# Patient Record
Sex: Female | Born: 1955
Health system: Southern US, Community
[De-identification: ages and names within clinical notes are randomized; demographics above are authoritative.]

## PROBLEM LIST (undated history)

## (undated) DIAGNOSIS — I10 Essential (primary) hypertension: Secondary | ICD-10-CM

## (undated) DIAGNOSIS — M797 Fibromyalgia: Secondary | ICD-10-CM

## (undated) DIAGNOSIS — E669 Obesity, unspecified: Secondary | ICD-10-CM

## (undated) DIAGNOSIS — I714 Abdominal aortic aneurysm, without rupture, unspecified: Secondary | ICD-10-CM

## (undated) DIAGNOSIS — E785 Hyperlipidemia, unspecified: Secondary | ICD-10-CM

## (undated) DIAGNOSIS — I4510 Unspecified right bundle-branch block: Secondary | ICD-10-CM

## (undated) DIAGNOSIS — I255 Ischemic cardiomyopathy: Secondary | ICD-10-CM

## (undated) DIAGNOSIS — K219 Gastro-esophageal reflux disease without esophagitis: Secondary | ICD-10-CM

## (undated) DIAGNOSIS — M1990 Unspecified osteoarthritis, unspecified site: Secondary | ICD-10-CM

## (undated) DIAGNOSIS — I2510 Atherosclerotic heart disease of native coronary artery without angina pectoris: Secondary | ICD-10-CM

## (undated) HISTORY — PX: APPENDECTOMY: SHX54

## (undated) HISTORY — DX: Abdominal aortic aneurysm, without rupture, unspecified: I71.40

## (undated) HISTORY — PX: BACK SURGERY: SHX140

## (undated) HISTORY — PX: CORONARY ARTERY BYPASS GRAFT: SHX141

## (undated) HISTORY — PX: EYE SURGERY: SHX253

---

## 1997-12-06 ENCOUNTER — Inpatient Hospital Stay (HOSPITAL_COMMUNITY): Admission: EM | Admit: 1997-12-06 | Discharge: 1997-12-07 | Payer: Self-pay | Admitting: *Deleted

## 1998-06-25 ENCOUNTER — Inpatient Hospital Stay (HOSPITAL_COMMUNITY): Admission: EM | Admit: 1998-06-25 | Discharge: 1998-06-28 | Payer: Self-pay | Admitting: Emergency Medicine

## 1998-06-25 ENCOUNTER — Encounter: Payer: Self-pay | Admitting: Emergency Medicine

## 1998-06-25 ENCOUNTER — Encounter: Payer: Self-pay | Admitting: Internal Medicine

## 1998-06-26 ENCOUNTER — Encounter: Payer: Self-pay | Admitting: Internal Medicine

## 1998-06-27 ENCOUNTER — Encounter: Payer: Self-pay | Admitting: Pediatrics

## 2000-07-22 ENCOUNTER — Inpatient Hospital Stay (HOSPITAL_COMMUNITY): Admission: EM | Admit: 2000-07-22 | Discharge: 2000-07-23 | Payer: Self-pay | Admitting: Emergency Medicine

## 2000-07-22 ENCOUNTER — Encounter: Payer: Self-pay | Admitting: Emergency Medicine

## 2000-07-23 ENCOUNTER — Encounter: Payer: Self-pay | Admitting: *Deleted

## 2016-05-02 DIAGNOSIS — H2703 Aphakia, bilateral: Secondary | ICD-10-CM | POA: Diagnosis not present

## 2016-12-14 DIAGNOSIS — Z882 Allergy status to sulfonamides status: Secondary | ICD-10-CM | POA: Diagnosis not present

## 2016-12-14 DIAGNOSIS — I4510 Unspecified right bundle-branch block: Secondary | ICD-10-CM | POA: Diagnosis not present

## 2016-12-14 DIAGNOSIS — R030 Elevated blood-pressure reading, without diagnosis of hypertension: Secondary | ICD-10-CM | POA: Diagnosis not present

## 2016-12-14 DIAGNOSIS — I10 Essential (primary) hypertension: Secondary | ICD-10-CM | POA: Diagnosis not present

## 2016-12-14 DIAGNOSIS — H9202 Otalgia, left ear: Secondary | ICD-10-CM | POA: Diagnosis not present

## 2016-12-14 DIAGNOSIS — J320 Chronic maxillary sinusitis: Secondary | ICD-10-CM | POA: Diagnosis not present

## 2016-12-14 DIAGNOSIS — I252 Old myocardial infarction: Secondary | ICD-10-CM | POA: Diagnosis not present

## 2016-12-14 DIAGNOSIS — R42 Dizziness and giddiness: Secondary | ICD-10-CM | POA: Diagnosis not present

## 2016-12-14 DIAGNOSIS — M25512 Pain in left shoulder: Secondary | ICD-10-CM | POA: Diagnosis not present

## 2016-12-14 DIAGNOSIS — Z87891 Personal history of nicotine dependence: Secondary | ICD-10-CM | POA: Diagnosis not present

## 2016-12-14 DIAGNOSIS — I2510 Atherosclerotic heart disease of native coronary artery without angina pectoris: Secondary | ICD-10-CM | POA: Diagnosis not present

## 2016-12-14 DIAGNOSIS — Z951 Presence of aortocoronary bypass graft: Secondary | ICD-10-CM | POA: Diagnosis not present

## 2016-12-25 DIAGNOSIS — I10 Essential (primary) hypertension: Secondary | ICD-10-CM | POA: Diagnosis not present

## 2016-12-25 DIAGNOSIS — R1310 Dysphagia, unspecified: Secondary | ICD-10-CM | POA: Diagnosis not present

## 2016-12-28 ENCOUNTER — Inpatient Hospital Stay (HOSPITAL_COMMUNITY)
Admission: EM | Admit: 2016-12-28 | Discharge: 2016-12-30 | DRG: 282 | Disposition: A | Discharge status: Home / Self Care | Payer: Medicare Other | Attending: Cardiovascular Disease | Admitting: Cardiovascular Disease

## 2016-12-28 ENCOUNTER — Inpatient Hospital Stay (HOSPITAL_COMMUNITY)
Admission: EM | Disposition: A | Discharge status: Home / Self Care | Payer: Self-pay | Source: Home / Self Care | Attending: Cardiovascular Disease

## 2016-12-28 ENCOUNTER — Encounter (HOSPITAL_COMMUNITY): Payer: Self-pay

## 2016-12-28 ENCOUNTER — Ambulatory Visit (HOSPITAL_COMMUNITY): Admit: 2016-12-28 | Payer: Self-pay | Admitting: Cardiovascular Disease

## 2016-12-28 DIAGNOSIS — Z951 Presence of aortocoronary bypass graft: Secondary | ICD-10-CM

## 2016-12-28 DIAGNOSIS — I249 Acute ischemic heart disease, unspecified: Secondary | ICD-10-CM | POA: Diagnosis present

## 2016-12-28 DIAGNOSIS — E669 Obesity, unspecified: Secondary | ICD-10-CM | POA: Diagnosis present

## 2016-12-28 DIAGNOSIS — I213 ST elevation (STEMI) myocardial infarction of unspecified site: Secondary | ICD-10-CM

## 2016-12-28 DIAGNOSIS — R079 Chest pain, unspecified: Secondary | ICD-10-CM | POA: Diagnosis not present

## 2016-12-28 DIAGNOSIS — E785 Hyperlipidemia, unspecified: Secondary | ICD-10-CM | POA: Diagnosis present

## 2016-12-28 DIAGNOSIS — Z87891 Personal history of nicotine dependence: Secondary | ICD-10-CM

## 2016-12-28 DIAGNOSIS — M797 Fibromyalgia: Secondary | ICD-10-CM | POA: Diagnosis present

## 2016-12-28 DIAGNOSIS — I214 Non-ST elevation (NSTEMI) myocardial infarction: Secondary | ICD-10-CM | POA: Diagnosis not present

## 2016-12-28 DIAGNOSIS — Z882 Allergy status to sulfonamides status: Secondary | ICD-10-CM

## 2016-12-28 DIAGNOSIS — I4510 Unspecified right bundle-branch block: Secondary | ICD-10-CM | POA: Diagnosis present

## 2016-12-28 DIAGNOSIS — I2510 Atherosclerotic heart disease of native coronary artery without angina pectoris: Secondary | ICD-10-CM | POA: Diagnosis present

## 2016-12-28 DIAGNOSIS — Z6831 Body mass index (BMI) 31.0-31.9, adult: Secondary | ICD-10-CM

## 2016-12-28 DIAGNOSIS — I10 Essential (primary) hypertension: Secondary | ICD-10-CM | POA: Diagnosis present

## 2016-12-28 DIAGNOSIS — I255 Ischemic cardiomyopathy: Secondary | ICD-10-CM | POA: Diagnosis present

## 2016-12-28 DIAGNOSIS — R0789 Other chest pain: Secondary | ICD-10-CM | POA: Diagnosis not present

## 2016-12-28 HISTORY — DX: Hyperlipidemia, unspecified: E78.5

## 2016-12-28 HISTORY — PX: LEFT HEART CATH AND CORONARY ANGIOGRAPHY: CATH118249

## 2016-12-28 HISTORY — DX: Essential (primary) hypertension: I10

## 2016-12-28 HISTORY — DX: Ischemic cardiomyopathy: I25.5

## 2016-12-28 HISTORY — DX: Fibromyalgia: M79.7

## 2016-12-28 HISTORY — DX: Acute ischemic heart disease, unspecified: I24.9

## 2016-12-28 HISTORY — DX: ST elevation (STEMI) myocardial infarction of unspecified site: I21.3

## 2016-12-28 HISTORY — DX: Unspecified right bundle-branch block: I45.10

## 2016-12-28 HISTORY — DX: Obesity, unspecified: E66.9

## 2016-12-28 HISTORY — DX: Atherosclerotic heart disease of native coronary artery without angina pectoris: I25.10

## 2016-12-28 LAB — COMPREHENSIVE METABOLIC PANEL
ALT: 15 U/L (ref 14–54)
ANION GAP: 13 (ref 5–15)
AST: 18 U/L (ref 15–41)
Albumin: 3.7 g/dL (ref 3.5–5.0)
Alkaline Phosphatase: 57 U/L (ref 38–126)
BUN: 7 mg/dL (ref 6–20)
CHLORIDE: 104 mmol/L (ref 101–111)
CO2: 23 mmol/L (ref 22–32)
CREATININE: 0.69 mg/dL (ref 0.44–1.00)
Calcium: 9 mg/dL (ref 8.9–10.3)
Glucose, Bld: 104 mg/dL — ABNORMAL HIGH (ref 65–99)
Potassium: 3.2 mmol/L — ABNORMAL LOW (ref 3.5–5.1)
SODIUM: 140 mmol/L (ref 135–145)
Total Bilirubin: 0.6 mg/dL (ref 0.3–1.2)
Total Protein: 6.8 g/dL (ref 6.5–8.1)

## 2016-12-28 LAB — CBC
HEMATOCRIT: 43.9 % (ref 36.0–46.0)
HEMATOCRIT: 40.4 % (ref 36.0–46.0)
HEMOGLOBIN: 13.5 g/dL (ref 12.0–15.0)
Hemoglobin: 14.5 g/dL (ref 12.0–15.0)
MCH: 31 pg (ref 26.0–34.0)
MCH: 30.8 pg (ref 26.0–34.0)
MCHC: 33 g/dL (ref 30.0–36.0)
MCHC: 33.4 g/dL (ref 30.0–36.0)
MCV: 94 fL (ref 78.0–100.0)
MCV: 92.2 fL (ref 78.0–100.0)
Platelets: 227 10*3/uL (ref 150–400)
Platelets: 236 10*3/uL (ref 150–400)
RBC: 4.67 MIL/uL (ref 3.87–5.11)
RBC: 4.38 MIL/uL (ref 3.87–5.11)
RDW: 13.3 % (ref 11.5–15.5)
RDW: 12.9 % (ref 11.5–15.5)
WBC: 10.4 10*3/uL (ref 4.0–10.5)
WBC: 10.3 10*3/uL (ref 4.0–10.5)

## 2016-12-28 LAB — BASIC METABOLIC PANEL
ANION GAP: 12 (ref 5–15)
BUN: 7 mg/dL (ref 6–20)
CHLORIDE: 104 mmol/L (ref 101–111)
CO2: 22 mmol/L (ref 22–32)
Calcium: 9.4 mg/dL (ref 8.9–10.3)
Creatinine, Ser: 0.72 mg/dL (ref 0.44–1.00)
Glucose, Bld: 102 mg/dL — ABNORMAL HIGH (ref 65–99)
POTASSIUM: 3.5 mmol/L (ref 3.5–5.1)
SODIUM: 138 mmol/L (ref 135–145)

## 2016-12-28 LAB — LIPID PANEL
CHOLESTEROL: 216 mg/dL — AB (ref 0–200)
HDL: 29 mg/dL — AB (ref >40)
LDL Cholesterol: 128 mg/dL — ABNORMAL HIGH (ref 0–99)
TRIGLYCERIDES: 297 mg/dL — AB (ref <150)
Total CHOL/HDL Ratio: 7.4 RATIO
VLDL: 59 mg/dL — AB (ref 0–40)

## 2016-12-28 LAB — APTT: aPTT: 157 seconds — ABNORMAL HIGH (ref 24–36)

## 2016-12-28 LAB — POCT I-STAT TROPONIN I: TROPONIN I, POC: 0.04 ng/mL (ref 0.00–0.08)

## 2016-12-28 LAB — PROTIME-INR
INR: 1.21
Prothrombin Time: 15.4 seconds — ABNORMAL HIGH (ref 11.4–15.2)

## 2016-12-28 LAB — TROPONIN I: Troponin I: 0.13 ng/mL (ref <0.03)

## 2016-12-28 SURGERY — LEFT HEART CATH AND CORONARY ANGIOGRAPHY
Anesthesia: LOCAL

## 2016-12-28 MED ORDER — MIDAZOLAM HCL 2 MG/2ML IJ SOLN
INTRAMUSCULAR | Status: AC
  Filled 2016-12-28: qty 2

## 2016-12-28 MED ORDER — NITROGLYCERIN 1 MG/10 ML FOR IR/CATH LAB
INTRA_ARTERIAL | Status: AC
  Filled 2016-12-28: qty 10

## 2016-12-28 MED ORDER — ENOXAPARIN SODIUM 40 MG/0.4ML ~~LOC~~ SOLN
40.0000 mg | SUBCUTANEOUS | Status: DC
  Administered 2016-12-29: 40 mg via SUBCUTANEOUS
  Filled 2016-12-28: qty 0.4

## 2016-12-28 MED ORDER — SODIUM CHLORIDE 0.9 % IV SOLN
INTRAVENOUS | Status: DC
  Administered 2016-12-28 – 2016-12-29 (×2): via INTRAVENOUS

## 2016-12-28 MED ORDER — SODIUM CHLORIDE 0.9% FLUSH
3.0000 mL | Freq: Two times a day (BID) | INTRAVENOUS | Status: DC
  Administered 2016-12-28 – 2016-12-30 (×4): 3 mL via INTRAVENOUS

## 2016-12-28 MED ORDER — HEPARIN (PORCINE) IN NACL 2-0.9 UNIT/ML-% IJ SOLN
INTRAMUSCULAR | Status: AC
  Filled 2016-12-28: qty 1000

## 2016-12-28 MED ORDER — LIDOCAINE HCL (PF) 1 % IJ SOLN
INTRAMUSCULAR | Status: AC
  Filled 2016-12-28: qty 30

## 2016-12-28 MED ORDER — ASPIRIN 81 MG PO CHEW
81.0000 mg | CHEWABLE_TABLET | Freq: Every day | ORAL | Status: DC
  Administered 2016-12-29 – 2016-12-30 (×2): 81 mg via ORAL
  Filled 2016-12-28 (×2): qty 1

## 2016-12-28 MED ORDER — CLOPIDOGREL BISULFATE 75 MG PO TABS
75.0000 mg | ORAL_TABLET | Freq: Every day | ORAL | Status: DC
  Administered 2016-12-29 – 2016-12-30 (×2): 75 mg via ORAL
  Filled 2016-12-28 (×2): qty 1

## 2016-12-28 MED ORDER — MIDAZOLAM HCL 2 MG/2ML IJ SOLN
INTRAMUSCULAR | Status: DC | PRN
  Administered 2016-12-28: 2 mg via INTRAVENOUS

## 2016-12-28 MED ORDER — FENTANYL CITRATE (PF) 100 MCG/2ML IJ SOLN
INTRAMUSCULAR | Status: DC | PRN
  Administered 2016-12-28: 25 ug via INTRAVENOUS

## 2016-12-28 MED ORDER — ISOSORBIDE MONONITRATE ER 30 MG PO TB24
30.0000 mg | ORAL_TABLET | Freq: Every day | ORAL | Status: DC
  Administered 2016-12-28: 30 mg via ORAL
  Filled 2016-12-28: qty 1

## 2016-12-28 MED ORDER — LIDOCAINE HCL (PF) 1 % IJ SOLN
INTRAMUSCULAR | Status: DC | PRN
Start: 2016-12-28 — End: 2016-12-28
  Administered 2016-12-28: 17 mL via SUBCUTANEOUS

## 2016-12-28 MED ORDER — ZOLPIDEM TARTRATE 5 MG PO TABS: 5.0000 mg | ORAL_TABLET | Freq: Every evening | ORAL | Status: DC | PRN

## 2016-12-28 MED ORDER — METOPROLOL TARTRATE 25 MG PO TABS
25.0000 mg | ORAL_TABLET | Freq: Two times a day (BID) | ORAL | Status: DC
  Administered 2016-12-28 – 2016-12-30 (×4): 25 mg via ORAL
  Filled 2016-12-28 (×4): qty 1

## 2016-12-28 MED ORDER — IOPAMIDOL (ISOVUE-370) INJECTION 76%
INTRAVENOUS | Status: AC
  Filled 2016-12-28: qty 100

## 2016-12-28 MED ORDER — ACETAMINOPHEN 325 MG PO TABS
650.0000 mg | ORAL_TABLET | ORAL | Status: DC | PRN
  Administered 2016-12-29 – 2016-12-30 (×4): 650 mg via ORAL
  Filled 2016-12-28 (×4): qty 2

## 2016-12-28 MED ORDER — ONDANSETRON HCL 4 MG/2ML IJ SOLN: 4.0000 mg | Freq: Four times a day (QID) | INTRAMUSCULAR | Status: DC | PRN

## 2016-12-28 MED ORDER — POTASSIUM CHLORIDE ER 10 MEQ PO TBCR
40.0000 meq | EXTENDED_RELEASE_TABLET | Freq: Once | ORAL | Status: AC
  Administered 2016-12-28: 40 meq via ORAL
  Filled 2016-12-28 (×2): qty 4

## 2016-12-28 MED ORDER — SODIUM CHLORIDE 0.9 % IV SOLN: 250.0000 mL | INTRAVENOUS | Status: DC | PRN

## 2016-12-28 MED ORDER — SODIUM CHLORIDE 0.9% FLUSH: 3.0000 mL | INTRAVENOUS | Status: DC | PRN

## 2016-12-28 MED ORDER — FENTANYL CITRATE (PF) 100 MCG/2ML IJ SOLN
INTRAMUSCULAR | Status: AC
  Filled 2016-12-28: qty 2

## 2016-12-28 MED ORDER — DIAZEPAM 5 MG PO TABS: 5.0000 mg | ORAL_TABLET | ORAL | Status: DC | PRN

## 2016-12-28 MED ORDER — IOPAMIDOL (ISOVUE-370) INJECTION 76%
INTRAVENOUS | Status: DC | PRN
  Administered 2016-12-28: 110 mL via INTRA_ARTERIAL

## 2016-12-28 MED ORDER — ATORVASTATIN CALCIUM 80 MG PO TABS
80.0000 mg | ORAL_TABLET | Freq: Every day | ORAL | Status: DC
  Administered 2016-12-29: 80 mg via ORAL
  Filled 2016-12-28: qty 1

## 2016-12-28 SURGICAL SUPPLY — 11 items
CATH INFINITI 5 FR IM (CATHETERS) ×1 IMPLANT
CATH INFINITI 5FR MULTPACK ANG (CATHETERS) ×1 IMPLANT
KIT ENCORE 26 ADVANTAGE (KITS) ×1 IMPLANT
KIT HEART LEFT (KITS) ×2 IMPLANT
PACK CARDIAC CATHETERIZATION (CUSTOM PROCEDURE TRAY) ×2 IMPLANT
SHEATH PINNACLE 6F 10CM (SHEATH) ×1 IMPLANT
SYR MEDRAD MARK V 150ML (SYRINGE) ×2 IMPLANT
TRANSDUCER W/STOPCOCK (MISCELLANEOUS) ×2 IMPLANT
TUBING CIL FLEX 10 FLL-RA (TUBING) ×2 IMPLANT
WIRE EMERALD 3MM-J .035X150CM (WIRE) IMPLANT
WIRE EMERALD 3MM-J .035X260CM (WIRE) ×1 IMPLANT

## 2016-12-28 NOTE — H&P (Signed)
Cardiology Admission History and Physical:   Patient ID: VINETA CARONE; [id number]; 1955-11-26   Admission date: 12/28/2016  Primary Care Provider: No primary care provider on file. Primary Cardiologist: New Primary Electrophysiologist:  None  Chief Complaint:  Evolving STEMI  Patient Profile:   TASHIMA SCARPULLA is a 61 y.o. female with a history of HTN, CAD s/p previous CABG x4 and stents, RBBB and fibromyalgia who presented to [facility] today via EMS as a code STEMI and taken emergently to cath lab for coronary angiography.   History of Present Illness:   Ms. Konen has apparently not seen a medical provider including a PCP or Cardiologist for many years due to financial issues. Per review of CareEverywhere, she was most recently evaluated in the [facility] ED in [place] on 12/14/16 for dizziness and treated with meclizine and Abx for a sinus infection. There was mention of a RBBB at that time. Otherwise, there is no available records in EPIC to review. Chart Review does show admissions under our cardiology group in 1999, 2000 and 2002, but no records are available.   She was in her usual state of health until this evening when she complained of nausea while walking around her kitchen as well as chest pain. EMS was called who called a code STEMI for slight ST elevations in lead I and AVL. Dr Tresa Endo reviewed her ECG and agreed that with ongoing chest pain and ECG changes she should be brought back emergently to the cath lab. Further history was unable to be obtained as she was already draped undergoing coronary angiography at the time of my evaluation.    Past Medical History:  Diagnosis Date  . CAD (coronary artery disease)   . Hypertension     Past Surgical History:  Procedure Laterality Date  . CORONARY ARTERY BYPASS GRAFT       Medications Prior to Admission: Prior to Admission medications   Not on File     Allergies:    Allergies  Allergen Reactions  . Sulfa Antibiotics Rash     Social History:   Social History   Social History  . Marital status: Married    Spouse name: N/A  . Number of children: N/A  . Years of education: N/A   Occupational History  . Not on file.   Social History Main Topics  . Smoking status: Former Smoker    Quit date: 12/28/1996  . Smokeless tobacco: Not on file  . Alcohol use No  . Drug use: Unknown  . Sexual activity: Not on file   Other Topics Concern  . Not on file   Social History Narrative  . No narrative on file    Family History:   The patient's family history is not on file.  Not able to be obtained given acuity of situation   ROS:  Please see the history of present illness.  All other ROS reviewed and negative.     Physical Exam/Data:   Vitals:   12/28/16 1900 12/28/16 1916  BP: (!) 171/94   Pulse: 98   Resp: 13   Temp: 98.7 F (37.1 C)   TempSrc: Oral   SpO2: 100% 100%  Weight: 190 lb (86.2 kg)   Height: 5\' 5"  (1.651 m)    No intake or output data in the 24 hours ending 12/28/16 1950 Filed Weights   12/28/16 1900  Weight: 190 lb (86.2 kg)   Body mass index is 31.62 kg/m.  General:  Well nourished, well developed,  in no acute distress HEENT: normal Lymph: no adenopathy Neck: noJVD Endocrine:  No thryomegaly Vascular: No carotid bruits; FA pulses 2+ bilaterally without bruits  Cardiac:  normal S1, S2; RRR; no murmur  Lungs:  clear to auscultation bilaterally, no wheezing, rhonchi or rales  Abd: soft, nontender, no hepatomegaly  Ext: no edema Musculoskeletal:  No deformities, BUE and BLE strength normal and equal Skin: warm and dry  Neuro:  CNs 2-12 intact, no focal abnormalities noted Psych:  Normal affect    EKG:  The ECG that was done was personally reviewed and demonstrates RBBB with slight STE in lead I and AVL  Relevant CV Studies: None  Laboratory Data:  Chemistry  Recent Labs Lab 12/28/16 1857  NA 138  K 3.5  CL 104  CO2 22  GLUCOSE 102*  BUN 7  CREATININE 0.72   CALCIUM 9.4  GFRNONAA >60  GFRAA >60  ANIONGAP 12    No results for input(s): PROT, ALBUMIN, AST, ALT, ALKPHOS, BILITOT in the last 168 hours. Hematology  Recent Labs Lab 12/28/16 1857  WBC 10.4  RBC 4.67  HGB 14.5  HCT 43.9  MCV 94.0  MCH 31.0  MCHC 33.0  RDW 13.3  PLT 227   Cardiac EnzymesNo results for input(s): TROPONINI in the last 168 hours.   Recent Labs Lab 12/28/16 1906  TROPIPOC 0.04    BNPNo results for input(s): BNP, PROBNP in the last 168 hours.  DDimer No results for input(s): DDIMER in the last 168 hours.  Radiology/Studies:  No results found.  Assessment and Plan:   Evolving STEMI: with ongoing chest pain she was taken back for emergent cardiac catheterization with possible PCI   Severity of Illness: The appropriate patient status for this patient is INPATIENT. Inpatient status is judged to be reasonable and necessary in order to provide the required intensity of service to ensure the patient's safety. The patient's presenting symptoms, physical exam findings, and initial radiographic and laboratory data in the context of their chronic comorbidities is felt to place them at high risk for further clinical deterioration. Furthermore, it is not anticipated that the patient will be medically stable for discharge from the hospital within 2 midnights of admission. The following factors support the patient status of inpatient.   " The patient's presenting symptoms include chest pain . " The worrisome physical exam findings include: patient appeared uncomfotable. " The initial radiographic and laboratory data are worrisome because of: possible STE on ECG . " The chronic co-morbidities include CAD s/p previous bypass and stents.   * I certify that at the point of admission it is my clinical judgment that the patient will require inpatient hospital care spanning beyond 2 midnights from the point of admission due to high intensity of service, high risk for  further deterioration and high frequency of surveillance required.*    Signed, Cline Crock, PA-C  12/28/2016 7:50 PM   Patient seen and examined. Agree with assessment and plan. Ms. Estera Ozier is a 61 year old female who has a history of hypertension, and had smoked for several years prior to undergoing CABG revascularization surgery in 1998 by Dr. Tyrone Sage.  At that time.  She quit smoking.  She reports that for vessels were bypassed.  She has undergone multiple stents.  She has right bundle branch block, and fibromyalgia.  She states that she has not seen her cardiologist in several years.  She has been [facility] by[facility] cardi[facility].  She is not had any recent  chest pain symptomatology.  Tonight, she developed chest tightness associated with nausea and diarrhea and shortness of breath.  EMS was called to her house.  Her ECG showed right bundle branch block and raised concern for ST segment elevation in the lateral and anterolateral leads.  A code STEMI was activated and [facility]  Upon arrival, her chest pain has abated.  She still complains of mild indigestion-like symptoms.  I discussed the catheterization procedure with the patient who agrees to undergo the emergent catheterization procedure with possible intervention if necessary.  Laboratory was sent from the Cath Lab.   Lennette Biharihomas A. Windie Marasco, MD, [facility] 12/28/2016 8:38 PM

## 2016-12-28 NOTE — Progress Notes (Signed)
CRITICAL VALUE ALERT  Critical Value: Troponin 0.13, Potassium 3.2  Date & Time Notied:  2130, 12/28/2016  Provider Notified: Means  Orders Received/Actions taken:Yes

## 2016-12-28 NOTE — ED Notes (Addendum)
Pt transported to cath lab by this RN and another RN. Pt on zoll, AxOx4. Pt given 4000 heparin by this RN prior to escort.

## 2016-12-28 NOTE — ED Triage Notes (Signed)
Per EMS, pt from home with CP x 1 hour with nausea while pt was walking around her kitchen. Code stemi activated. Pt has hx of CABG x 4 in 1998. Pt states that pain is easing now. Received 324 asa. Pt was recently placed on BP medication. VS 120/82, HR 102, 96% on RA, RR 18. Breath sounds clear. Pt AxOx4. Pt was placed on antibiotic recently for sinus infection.

## 2016-12-28 NOTE — Progress Notes (Signed)
   Family escorted to [facility] waiting area.

## 2016-12-29 ENCOUNTER — Encounter (HOSPITAL_COMMUNITY): Payer: Self-pay | Admitting: *Deleted

## 2016-12-29 LAB — BASIC METABOLIC PANEL
ANION GAP: 9 (ref 5–15)
BUN: 5 mg/dL — AB (ref 6–20)
CO2: 25 mmol/L (ref 22–32)
CREATININE: 0.63 mg/dL (ref 0.44–1.00)
Calcium: 8.7 mg/dL — ABNORMAL LOW (ref 8.9–10.3)
Chloride: 107 mmol/L (ref 101–111)
GFR calc Af Amer: >60 mL/min (ref >60)
GFR calc non Af Amer: >60 mL/min (ref >60)
GLUCOSE: 103 mg/dL — AB (ref 65–99)
POTASSIUM: 4 mmol/L (ref 3.5–5.1)
Sodium: 141 mmol/L (ref 135–145)

## 2016-12-29 LAB — CBC
HEMATOCRIT: 38.5 % (ref 36.0–46.0)
Hemoglobin: 12.8 g/dL (ref 12.0–15.0)
MCH: 31.1 pg (ref 26.0–34.0)
MCHC: 33.2 g/dL (ref 30.0–36.0)
MCV: 93.7 fL (ref 78.0–100.0)
PLATELETS: 212 10*3/uL (ref 150–400)
RBC: 4.11 MIL/uL (ref 3.87–5.11)
RDW: 13 % (ref 11.5–15.5)
WBC: 8.5 10*3/uL (ref 4.0–10.5)

## 2016-12-29 LAB — MRSA PCR SCREENING: MRSA by PCR: NEGATIVE

## 2016-12-29 MED ORDER — ISOSORBIDE MONONITRATE ER 60 MG PO TB24
60.0000 mg | ORAL_TABLET | Freq: Every day | ORAL | Status: DC
  Administered 2016-12-29 – 2016-12-30 (×2): 60 mg via ORAL
  Filled 2016-12-29 (×2): qty 1

## 2016-12-29 MED ORDER — ALUM & MAG HYDROXIDE-SIMETH 200-200-20 MG/5ML PO SUSP
30.0000 mL | Freq: Four times a day (QID) | ORAL | Status: DC | PRN
  Administered 2016-12-29: 30 mL via ORAL
  Filled 2016-12-29: qty 30

## 2016-12-29 NOTE — Progress Notes (Signed)
Progress Note  Patient Name: Sharon Saunders Date of Encounter: 12/29/2016  Primary Cardiologist: Tresa EndoKelly (new)  Subjective   Feeling much improved since yesterday.  No further chest pain.  Inpatient Medications    Scheduled Meds: . aspirin  81 mg Oral Daily  . atorvastatin  80 mg Oral q1800  . clopidogrel  75 mg Oral Q breakfast  . enoxaparin (LOVENOX) injection  40 mg Subcutaneous Q24H  . isosorbide mononitrate  30 mg Oral Daily  . metoprolol tartrate  25 mg Oral BID  . sodium chloride flush  3 mL Intravenous Q12H   Continuous Infusions: . sodium chloride 100 mL/hr at 12/29/16 0454  . sodium chloride     PRN Meds: sodium chloride, acetaminophen, alum & mag hydroxide-simeth, diazepam, ondansetron (ZOFRAN) IV, sodium chloride flush, zolpidem   Vital Signs    Vitals:   12/29/16 0430 12/29/16 0500 12/29/16 0600 12/29/16 0615  BP:  108/68 111/74   Pulse:  70 74 72  Resp:  17 13 (!) 21  Temp: 98.4 F (36.9 C)     TempSrc: Oral     SpO2:  99% 100% 96%  Weight:    194 lb 0.1 oz (88 kg)  Height:        Intake/Output Summary (Last 24 hours) at 12/29/16 0838 Last data filed at 12/29/16 0600  Gross per 24 hour  Intake          1356.25 ml  Output              400 ml  Net           956.25 ml   Filed Weights   12/28/16 1900 12/28/16 2045 12/29/16 0615  Weight: 190 lb (86.2 kg) 190 lb (86.2 kg) 194 lb 0.1 oz (88 kg)    Telemetry    Sinus rhythm - Personally Reviewed  ECG    SR, RBBB - Personally Reviewed  Physical Exam   GEN: No acute distress.   Neck: No JVD Cardiac: RRR, no murmurs, rubs, or gallops.  Respiratory: Clear to auscultation bilaterally. GI: Soft, nontender, non-distended  MS: No edema; No deformity. Neuro:  Nonfocal  Psych: Normal affect   Labs    Chemistry Recent Labs Lab 12/28/16 1857 12/28/16 1930 12/29/16 0241  NA 138 140 141  K 3.5 3.2* 4.0  CL 104 104 107  CO2 22 23 25   GLUCOSE 102* 104* 103*  BUN 7 7 5*  CREATININE  0.72 0.69 0.63  CALCIUM 9.4 9.0 8.7*  PROT  --  6.8  --   ALBUMIN  --  3.7  --   AST  --  18  --   ALT  --  15  --   ALKPHOS  --  57  --   BILITOT  --  0.6  --   GFRNONAA >60 >60 >60  GFRAA >60 >60 >60  ANIONGAP 12 13 9      Hematology Recent Labs Lab 12/28/16 1857 12/28/16 1930 12/29/16 0241  WBC 10.4 10.3 8.5  RBC 4.67 4.38 4.11  HGB 14.5 13.5 12.8  HCT 43.9 40.4 38.5  MCV 94.0 92.2 93.7  MCH 31.0 30.8 31.1  MCHC 33.0 33.4 33.2  RDW 13.3 12.9 13.0  PLT 227 236 212    Cardiac Enzymes Recent Labs Lab 12/28/16 1930  TROPONINI 0.13*    Recent Labs Lab 12/28/16 1906  TROPIPOC 0.04     BNPNo results for input(s): BNP, PROBNP in the last 168 hours.  DDimer No results for input(s): DDIMER in the last 168 hours.   Radiology    No results found.  Cardiac Studies   LHC  LM-2 lesion, 50 %stenosed.  LM-1 lesion, 20 %stenosed.  Ost 1st Diag lesion, 100 %stenosed.  Prox LAD to Mid LAD lesion, 80 %stenosed.  Mid LAD to Dist LAD lesion, 80 %stenosed.  Dist LAD lesion, 90 %stenosed.  3rd Mrg lesion, 40 %stenosed.  Ost 3rd Mrg lesion, 30 %stenosed.  Prox RCA lesion, 40 %stenosed.  Mid RCA lesion, 50 %stenosed.  Dist RCA lesion, 40 %stenosed.  RPDA lesion, 50 %stenosed  Patient Profile     61 y.o. female with a history of HTN, CAD s/p previous CABG x4 and stents, RBBB and fibromyalgia who presented to [facility] today via EMS as a code STEMI and taken emergently to cath lab for coronary angiography.   Assessment & Plan    1. NSTEMI: currently with mild chest discomfort but much improved. Has severe CAD with the decision made for medical therapy. Currently on plavix, aspirin, atorvastatin, imdur (increase to 60). BP well controlled with metoprolol. Plan to transfer to the floor and ambulate. If chest pain free, may be discharge tomorrow.  2. Hypertension: well controlled today on current medications. No changes  3. Hyperlipidemia: LDL above goal of 70.  Recently started on 80 atorvastatin.  Signed, Cathe Bilger Jorja Loa, MD  12/29/2016, 8:38 AM

## 2016-12-29 NOTE — ED Provider Notes (Signed)
[facility]-EMERGENCY DEPT Provider Note   CSN: [id number] Arrival date & time: 12/28/16  1853     History   Chief Complaint Chief Complaint  Patient presents with  . Code STEMI    HPI Sharon Saunders is a 61 y.o. female. With history of CAD s/p CABG 1998, HTN, Who presents with concerns for STEMI. Patient reports onset of substernal chest pain 1 hour prior to arrival. Pain is described as indigestion type pain. She denies any concurrent shortness of breath, lightheadedness, palpitations, abdominal pain, nausea or vomiting. She reports symptoms are different from her previous heart attack. Recent medication changes include that she was started on an antihypertensive.  HPI  Past Medical History:  Diagnosis Date  . CAD (coronary artery disease)   . Fibromyalgia   . Hypertension   . RBBB     Patient Active Problem List   Diagnosis Date Noted  . STEMI (ST elevation myocardial infarction) (HCC) 12/28/2016  . ACS (acute coronary syndrome) (HCC) 12/28/2016  . Hypertension   . CAD (coronary artery disease)   . RBBB   . Fibromyalgia     Past Surgical History:  Procedure Laterality Date  . CORONARY ARTERY BYPASS GRAFT      OB History    No data available       Home Medications    Prior to Admission medications   Not on File    Family History History reviewed. No pertinent family history.  Social History Social History  Substance Use Topics  . Smoking status: Former Smoker    Quit date: 12/28/1996  . Smokeless tobacco: Former Neurosurgeon  . Alcohol use No     Allergies   Sulfa antibiotics   Review of Systems Review of Systems  Constitutional: Negative for chills and fever.  HENT: Negative for ear pain and sore throat.   Eyes: Negative for pain and visual disturbance.  Respiratory: Negative for cough and shortness of breath.   Cardiovascular: Positive for chest pain. Negative for palpitations.  Gastrointestinal: Negative for abdominal pain and vomiting.    Genitourinary: Negative for dysuria and hematuria.  Musculoskeletal: Negative for arthralgias and back pain.  Skin: Negative for color change and rash.  Neurological: Negative for seizures and syncope.  All other systems reviewed and are negative.    Physical Exam Updated Vital Signs BP 130/79   Pulse 67   Temp 98.3 F (36.8 C) (Oral)   Resp 14   Ht 5\' 5"  (1.651 m)   Wt 86.2 kg (190 lb)   SpO2 96%   BMI 31.62 kg/m   Physical Exam  Constitutional: She appears well-developed and well-nourished. She appears distressed.  HENT:  Head: Normocephalic and atraumatic.  Eyes: Conjunctivae are normal.  Neck: Neck supple.  Cardiovascular: Normal rate and regular rhythm.   No murmur heard. Pulmonary/Chest: Effort normal and breath sounds normal. No respiratory distress.  Abdominal: Soft. There is no tenderness.  Musculoskeletal: She exhibits no edema.  Neurological: She is alert.  Skin: Skin is warm and dry.  Psychiatric: She has a normal mood and affect.  Nursing note and vitals reviewed.    ED Treatments / Results  Labs (all labs ordered are listed, but only abnormal results are displayed) Labs Reviewed  BASIC METABOLIC PANEL - Abnormal; Notable for the following:       Result Value   Glucose, Bld 102 (*)    All other components within normal limits  COMPREHENSIVE METABOLIC PANEL - Abnormal; Notable for the following:  Potassium 3.2 (*)    Glucose, Bld 104 (*)    All other components within normal limits  LIPID PANEL - Abnormal; Notable for the following:    Cholesterol 216 (*)    Triglycerides 297 (*)    HDL 29 (*)    VLDL 59 (*)    LDL Cholesterol 128 (*)    All other components within normal limits  PROTIME-INR - Abnormal; Notable for the following:    Prothrombin Time 15.4 (*)    All other components within normal limits  APTT - Abnormal; Notable for the following:    aPTT 157 (*)    All other components within normal limits  TROPONIN I - Abnormal; Notable  for the following:    Troponin I 0.13 (*)    All other components within normal limits  MRSA PCR SCREENING  CBC  CBC  HEMOGLOBIN A1C  CBC  BASIC METABOLIC PANEL  I-STAT TROPONIN, ED  POCT I-STAT TROPONIN I    EKG  EKG Interpretation None       Radiology No results found.  Procedures Procedures (including critical care time)  Medications Ordered in ED Medications  acetaminophen (TYLENOL) tablet 650 mg (not administered)  ondansetron (ZOFRAN) injection 4 mg (not administered)  enoxaparin (LOVENOX) injection 40 mg (not administered)  0.9 %  sodium chloride infusion ( Intravenous New Bag/Given 12/28/16 2051)  sodium chloride flush (NS) 0.9 % injection 3 mL (3 mLs Intravenous Given 12/28/16 2103)  sodium chloride flush (NS) 0.9 % injection 3 mL (not administered)  0.9 %  sodium chloride infusion (not administered)  diazepam (VALIUM) tablet 5 mg (not administered)  atorvastatin (LIPITOR) tablet 80 mg (not administered)  aspirin chewable tablet 81 mg (not administered)  clopidogrel (PLAVIX) tablet 75 mg (not administered)  zolpidem (AMBIEN) tablet 5 mg (not administered)  metoprolol tartrate (LOPRESSOR) tablet 25 mg (25 mg Oral Given 12/28/16 2204)  isosorbide mononitrate (IMDUR) 24 hr tablet 30 mg (30 mg Oral Given 12/28/16 2207)  alum & mag hydroxide-simeth (MAALOX/MYLANTA) 200-200-20 MG/5ML suspension 30 mL (30 mLs Oral Given 12/29/16 0019)  potassium chloride (K-DUR) CR tablet 40 mEq (40 mEq Oral Given 12/28/16 2204)     Initial Impression / Assessment and Plan / ED Course  I have reviewed the triage vital signs and the nursing notes.  Pertinent labs & imaging results that were available during my care of the patient were reviewed by me and considered in my medical decision making (see chart for details).     Patient is a [age] year old female with history of CAD s/p CABG who presents with chest pain. Code STEMI called in route by EMS for slight ST elevations in lead 1 and  aVL. She was hemodynamically stable in the ED, chest pain-free. Heparin bolus was given, and she was taken emergently to the Cath Lab.   Patient was in agreement with plan at time of admission.  Patient and plan of care discussed with Attending physician, Dr. Denton LankSteinl.    Final Clinical Impressions(s) / ED Diagnoses   Final diagnoses:  ST elevation myocardial infarction (STEMI), unspecified artery (HCC)  Chest pain, unspecified type    New Prescriptions There are no discharge medications for this patient.    Wynelle ClevelandMizera, Arissa Fagin, MD 12/29/16 [id number]    Cathren LaineSteinl, Kevin, MD 12/29/16 [phone]

## 2016-12-29 NOTE — Plan of Care (Signed)
Problem: Activity: Goal: Risk for activity intolerance will decrease Outcome: Progressing Patient was up to the chair from 1000-1400 and went for a walk around the unit with a one assist, stand-by.

## 2016-12-29 NOTE — Plan of Care (Signed)
Problem: Nutrition: Goal: Adequate nutrition will be maintained Outcome: Not Progressing Patient reports some pre-admission issues with her esophagus in which she only eats clear liquid to mechanical soft diet baseline. Patient is currently remains on clear liquid diet by choice.

## 2016-12-30 ENCOUNTER — Encounter (HOSPITAL_COMMUNITY): Payer: Self-pay | Admitting: Nurse Practitioner

## 2016-12-30 ENCOUNTER — Other Ambulatory Visit (HOSPITAL_COMMUNITY): Payer: Self-pay

## 2016-12-30 DIAGNOSIS — E669 Obesity, unspecified: Secondary | ICD-10-CM | POA: Diagnosis present

## 2016-12-30 DIAGNOSIS — I255 Ischemic cardiomyopathy: Secondary | ICD-10-CM | POA: Diagnosis present

## 2016-12-30 LAB — HEMOGLOBIN A1C
HEMOGLOBIN A1C: 5.1 % (ref 4.8–5.6)
Mean Plasma Glucose: 100 mg/dL

## 2016-12-30 MED ORDER — ATORVASTATIN CALCIUM 80 MG PO TABS: 80.0000 mg | ORAL_TABLET | Freq: Every day | ORAL | 6 refills | Status: DC

## 2016-12-30 MED ORDER — NITROGLYCERIN 0.4 MG SL SUBL: 0.4000 mg | SUBLINGUAL_TABLET | SUBLINGUAL | 3 refills | Status: DC | PRN

## 2016-12-30 MED ORDER — METOPROLOL TARTRATE 25 MG PO TABS: 25.0000 mg | ORAL_TABLET | Freq: Two times a day (BID) | ORAL | 6 refills | Status: DC

## 2016-12-30 MED ORDER — ISOSORBIDE MONONITRATE ER 60 MG PO TB24: 60.0000 mg | ORAL_TABLET | Freq: Every day | ORAL | 6 refills | Status: DC

## 2016-12-30 MED ORDER — ASPIRIN 81 MG PO CHEW: 81.0000 mg | CHEWABLE_TABLET | Freq: Every day | ORAL | 6 refills | Status: DC

## 2016-12-30 MED ORDER — CLOPIDOGREL BISULFATE 75 MG PO TABS: 75.0000 mg | ORAL_TABLET | Freq: Every day | ORAL | 6 refills | Status: DC

## 2016-12-30 NOTE — Discharge Instructions (Signed)
**  PLEASE REMEMBER TO BRING ALL OF YOUR MEDICATIONS TO EACH OF YOUR FOLLOW-UP OFFICE VISITS. ° °NO HEAVY LIFTING X 4 WEEKS. °NO SEXUAL ACTIVITY X 4 WEEKS. °NO DRIVING X 2 WEEKS. °NO SOAKING BATHS, HOT TUBS, POOLS, ETC., X 7 DAYS.  ° °Groin Site Care °Refer to this sheet in the next few weeks. These instructions provide you with information on caring for yourself after your procedure. Your caregiver may also give you more specific instructions. Your treatment has been planned according to current medical practices, but problems sometimes occur. Call your caregiver if you have any problems or questions after your procedure. °HOME CARE INSTRUCTIONS °· You may shower 24 hours after the procedure. Remove the bandage (dressing) and gently wash the site with plain soap and water. Gently pat the site dry.  °· Do not apply powder or lotion to the site.  °· Do not sit in a bathtub, swimming pool, or whirlpool for 5 to 7 days.  °· No bending, squatting, or lifting anything over 10 pounds (4.5 kg) as directed by your caregiver.  °· Inspect the site at least twice daily.  °· Do not drive home if you are discharged the same day of the procedure. Have someone else drive you.  ° °What to expect: °· Any bruising will usually fade within 1 to 2 weeks.  °· Blood that collects in the tissue (hematoma) may be painful to the touch. It should usually decrease in size and tenderness within 1 to 2 weeks.  °SEEK IMMEDIATE MEDICAL CARE IF: °· You have unusual pain at the groin site or down the affected leg.  °· You have redness, warmth, swelling, or pain at the groin site.  °· You have drainage (other than a small amount of blood on the dressing).  °· You have chills.  °· You have a fever or persistent symptoms for more than 72 hours.  °· You have a fever and your symptoms suddenly get worse.  °· Your leg becomes pale, cool, tingly, or numb.  °You have heavy bleeding from the site. Hold pressure on the site.  °_____________ ° °  ° °10 Habits  of Highly Healthy People ° °[facility] wants to help you get well and stay well.  Live a longer, healthier life by practicing healthy habits every day. ° °1.  Visit your primary care provider regularly. °2.  Make time for family and friends.  Healthy relationships are important. °3.  Take medications as directed by your provider. °4.  Maintain a healthy weight and a trim waistline. °5.  Eat healthy meals and snacks, rich in fruits, vegetables, whole grains, and lean proteins. °6.  Get moving every day - aim for 150 minutes of moderate physical activity each week. °7.  Don't smoke. °8.  Avoid alcohol or drink in moderation. °9.  Manage stress through meditation or mindful relaxation. °10.  Get seven to nine hours of quality sleep each night. ° °Want more information on healthy habits?  To learn more about these and other healthy habits, visit [email or web address]. °_____________ °  °  ° °

## 2016-12-30 NOTE — Discharge Summary (Signed)
Discharge Summary    Patient ID: Sharon Saunders,  MRN: [id number], DOB/AGE: [birth year] 61 y.o.  Admit date: 12/28/2016 Discharge date: 12/30/2016  Primary Care Provider: No primary care provider on file. Primary Cardiologist: Bishop Limbo, MD   Discharge Diagnoses    Principal Problem:   STEMI (ST elevation myocardial infarction) [facility])  **s/p cath this admission revealing occluded grafts and moderate LAD dzs  Med Rx  Active Problems:   CAD (coronary artery disease)   ACS (acute coronary syndrome) (HCC)   Hypertension   Ischemic cardiomyopathy  **EF 40-45% by LV gram.   Obesity   RBBB   Fibromyalgia   Allergies Allergies  Allergen Reactions  . Sulfa Antibiotics Rash    Diagnostic Studies/Procedures    Cardiac Catheterization 8.10.2018  Coronary Findings  Dominance: Right  Left Main  There is mild distal left main tapering of 20% prior to its bifurcation into the LAD and left circumflex vessel  LM-1 lesion, 20% stenosed.  LM-2 lesion, 50% stenosed.  Left Anterior Descending  The LAD has proximal 50% stenoses. The proximal diagonal vessels are not visualized and may be occluded. There is diffuse in-stent restenosis in the stent in the mid segment with narrowing of 80%, followed by diffuse irregularity with narrowing of 80%, extending to the apex leading up to 90% diffusely apically. There is no competitive filling.  Prox LAD to Mid LAD lesion, 80% stenosed. The lesion was previously treated.  Mid LAD to Dist LAD lesion, 80% stenosed.  Dist LAD lesion, 90% stenosed.  First Diagonal Branch  Ost 1st Diag lesion, 100% stenosed.  Left Circumflex  The left circumflex has mild irregularity but is without significant stenoses. There is no competitive filling.  First Obtuse Marginal Branch  Vessel is small in size.  Second Obtuse Marginal Branch  Vessel is small in size.  Third Obtuse Marginal Branch  Ost 3rd Mrg lesion, 30% stenosed.  3rd Mrg lesion, 40% stenosed.    Right Coronary Artery  The RCA is large caliber dominant vessel that has diffuse disease with narrowings of 40, 50 and 40% in the proximal, mid and mid-distal segment with 50% PDA stenosis. There is no competitive filling from any graft.  Prox RCA lesion, 40% stenosed.  Mid RCA lesion, 50% stenosed.  Dist RCA lesion, 40% stenosed.  Right Posterior Descending Artery  RPDA lesion, 50% stenosed.   Two vein markers are visualized in the aorta.  It is uncertain as to where these grafts had gone and whether or not they were sequential grafts.  Both grafts are occluded at its origin.   The left internal mammary artery was injected but apparently this did not anastomose into any coronary vessel. Left Ventricle There is moderate LV dysfunction with global ejection fraction at 40-45%. There is moderate hypocontractility of the mid anterolateral wall and a focal apical aneurysmal segment.  RECOMMENDATION: Medical therapy.  I Sharon Saunders start patient on aspirin and Plavix.  Patient Sharon Saunders be started on aggressive lipid-lowering therapy.  Nitrates Sharon Saunders be added to medical regimen with beta blocker therapy with consideration of possible amlodipine.   _____________   History of Present Illness     61 year old female with prior history of CAD status post CABG times 09/06/1996, hypertension, right bundle branch block, and fibromyalgia, who presented to [facility] August 10 as a code STEMI after developing chest pain and nausea earlier in the day at home. Upon EMS arrival, ECG showed ST segment elevation in leads 1 and aVL and  she was taken emergently to the [facility] cardiac catheterization laboratory.  [facility]     Consultants: None   Patient underwent emergent diagnostic catheterization revealing moderate diffuse three-vessel native coronary artery disease with significant diffuse LAD disease including in-stent restenosis in the mid LAD, and an occlusion of the first diagonal. 2 vein graft markers were  visualized in the aorta and were occluded at their origins. It is unknown if they were sequential grafts. The left internal mammary artery was injected but did not anastomose to any coronary vessel. EF was 40-45%. Medical therapy was recommended and she was placed on aspirin, Plavix, statin, nitrate, and beta blocker therapy. She was monitored in the coronary intensive care unit where troponin rose to 0.13. She had no further chest discomfort and has been ambulating without difficulty. Sharon Saunders Sharon Saunders be discharged home today in good condition and we have arranged for office follow-up in a few weeks. _____________  Discharge Vitals Blood pressure 122/79, pulse 84, temperature 97.9 F (36.6 C), temperature source Oral, resp. rate 18, height 5\' 5"  (1.651 m), weight 194 lb 0.1 oz (88 kg), SpO2 100 %.  Filed Weights   12/28/16 1900 12/28/16 2045 12/29/16 0615  Weight: 190 lb (86.2 kg) 190 lb (86.2 kg) 194 lb 0.1 oz (88 kg)    Labs & Radiologic Studies    CBC  Recent Labs  12/28/16 1930 12/29/16 0241  WBC 10.3 8.5  HGB 13.5 12.8  HCT 40.4 38.5  MCV 92.2 93.7  PLT 236 212   Basic Metabolic Panel  Recent Labs  12/28/16 1930 12/29/16 0241  NA 140 141  K 3.2* 4.0  CL 104 107  CO2 23 25  GLUCOSE 104* 103*  BUN 7 5*  CREATININE 0.69 0.63  CALCIUM 9.0 8.7*   Liver Function Tests  Recent Labs  12/28/16 1930  AST 18  ALT 15  ALKPHOS 57  BILITOT 0.6  PROT 6.8  ALBUMIN 3.7   Cardiac Enzymes  Recent Labs  12/28/16 1930  TROPONINI 0.13*    Hemoglobin A1C  Recent Labs  12/28/16 1930  HGBA1C 5.1   Fasting Lipid Panel  Recent Labs  12/28/16 1930  CHOL 216*  HDL 29*  LDLCALC 128*  TRIG 297*  CHOLHDL 7.4  _____________  No results found. Disposition   Pt is being discharged home today in good condition.  Follow-up Plans & Appointments    Follow-up Information    Sharon Saunders, Sharon Saunders Follow up on 01/17/2017.   Specialties:  Cardiology, Radiology Why:  9:00  AM Contact information: [address] [place] [address] [phone]            Discharge Medications   Current Discharge Medication List    START taking these medications   Details  aspirin 81 MG chewable tablet Chew 1 tablet (81 mg total) by mouth daily. Qty: 30 tablet, Refills: 6    atorvastatin (LIPITOR) 80 MG tablet Take 1 tablet (80 mg total) by mouth daily at 6 PM. Qty: 30 tablet, Refills: 6    clopidogrel (PLAVIX) 75 MG tablet Take 1 tablet (75 mg total) by mouth daily with breakfast. Qty: 30 tablet, Refills: 6    isosorbide mononitrate (IMDUR) 60 MG 24 hr tablet Take 1 tablet (60 mg total) by mouth daily. Qty: 30 tablet, Refills: 6    metoprolol tartrate (LOPRESSOR) 25 MG tablet Take 1 tablet (25 mg total) by mouth 2 (two) times daily. Qty: 60 tablet, Refills: 6  nitroGLYCERIN (NITROSTAT) 0.4 MG SL tablet Place 1 tablet (0.4 mg total) under the tongue every 5 (five) minutes as needed for chest pain. Qty: 25 tablet, Refills: 3      CONTINUE these medications which have NOT CHANGED   Details  acetaminophen (TYLENOL) 500 MG tablet Take 500-1,000 mg by mouth every 6 (six) hours as needed for headache (pain).    meclizine (ANTIVERT) 25 MG tablet Take 25 mg by mouth 3 (three) times daily as needed for dizziness.      STOP taking these medications     amLODipine (NORVASC) 5 MG tablet      Aspirin-Acetaminophen-Caffeine (GOODY HEADACHE PO)          Aspirin prescribed at discharge?  Yes High Intensity Statin Prescribed? (Lipitor 40-80mg  or Crestor 20-40mg ): Yes Beta Blocker Prescribed? No: Yes For EF <40%, was ACEI/ARB Prescribed? No: N/A ADP Receptor Inhibitor Prescribed? (i.e. Plavix etc.-Includes Medically Managed Patients): Yes For EF <40%, Aldosterone Inhibitor Prescribed? No: N/A Was EF assessed during THIS hospitalization? Yes Was Cardiac Rehab II ordered? (Included Medically managed Patients): Yes   Outstanding Labs/Studies    F/u Lipids/LFT's in 6 wks (new statin).  Duration of Discharge Encounter   Greater than 30 minutes including physician time.  Signed, Sharon Duckinghristopher Berge NP 12/30/2016, 11:20 AM  I have seen and examined this patient with Sharon Saunders.  Agree with above, note added to reflect my findings.  On exam, RRR, no murmurs, lungs clear. Presented with NSTEMI. Cath showing CAD and deemed to benefit from medical management. Chest pain free today. Plan for discharge with follow up in clinic.    Sharon Lockner M. Deztinee Lohmeyer MD 12/30/2016 11:54 AM

## 2016-12-30 NOTE — Care Management Note (Signed)
Case Management Note  Patient Details  Name: Christel MormonDeborah L Kotula MRN: [id number] Date of Birth: Dec 27, 1955  Subjective/Objective: 61 y.o. F who has Medicare Parts A+B. Instructed in the importance of obtaining Part D. Showed pt Good-Rx and encouraged her to use Samples when available from MD. Most of her BP [facility] and are on the 4.00 dollar list.  Instructed pt to make sure she lets MDs know of her financial situation and need for least expensive meds. Acknowledged information as helpful. Acknowledged that she has PCP.                  Action/Plan:CM will sign off for now but will be available should additional discharge needs arise or disposition change.    Expected Discharge Date:  12/31/16               Expected Discharge Plan:  Home/Self Care  In-House Referral:  NA  Discharge planning Services  CM Consult (Unable to MATCH this pt  as she has Medicare )  Post Acute Care Choice:  NA Choice offered to:  Patient  DME Arranged:  N/A DME Agency:     HH Arranged:    HH Agency:     Status of Service:  Completed, signed off  If discussed at Long Length of Stay Meetings, dates discussed:    Additional Comments:  Yvone NeuCrutchfield, Glendoris Nodarse M, RN 12/30/2016, 9:43 AM

## 2016-12-30 NOTE — Progress Notes (Signed)
Assessment unchanged. Discussed D/C instructions with pt including f/u appointments and new medications. Verbalized understanding. RX already called into facility. E[facility] appointment printed on AVS and is not needed per Ward Givenshris Berge, NP. Pt informed of this and verbalized understanding.  IVs and tele removed. Pt left with belongings accompanied by RN.

## 2016-12-30 NOTE — Progress Notes (Signed)
Progress Note  Patient Name: Sharon Saunders Date of Encounter: 12/30/2016  Primary Cardiologist: Tresa EndoKelly (new)  Subjective   Continues to feel well. No chest pain. Walked around the ICU without issue.  Inpatient Medications    Scheduled Meds: . aspirin  81 mg Oral Daily  . atorvastatin  80 mg Oral q1800  . clopidogrel  75 mg Oral Q breakfast  . enoxaparin (LOVENOX) injection  40 mg Subcutaneous Q24H  . isosorbide mononitrate  60 mg Oral Daily  . metoprolol tartrate  25 mg Oral BID  . sodium chloride flush  3 mL Intravenous Q12H   Continuous Infusions: . sodium chloride Stopped (12/29/16 1509)  . sodium chloride     PRN Meds: sodium chloride, acetaminophen, alum & mag hydroxide-simeth, diazepam, ondansetron (ZOFRAN) IV, sodium chloride flush, zolpidem   Vital Signs    Vitals:   12/30/16 0300 12/30/16 0341 12/30/16 0400 12/30/16 0700  BP: 109/69 109/69 91/67   Pulse: 72 72 71 77  Resp: 19 13 14 15   Temp:  98.1 F (36.7 C)    TempSrc:  Oral    SpO2: 96% 97% 94% 98%  Weight:      Height:        Intake/Output Summary (Last 24 hours) at 12/30/16 0758 Last data filed at 12/29/16 1100  Gross per 24 hour  Intake             1080 ml  Output                0 ml  Net             1080 ml   Filed Weights   12/28/16 1900 12/28/16 2045 12/29/16 0615  Weight: 190 lb (86.2 kg) 190 lb (86.2 kg) 194 lb 0.1 oz (88 kg)    Telemetry    SR - Personally Reviewed  ECG    SR, RBBB - Personally Reviewed  Physical Exam   GEN: Well nourished, well developed, in no acute distress  HEENT: normal  Neck: no JVD, carotid bruits, or masses Cardiac: RRR; no murmurs, rubs, or gallops,no edema  Respiratory:  clear to auscultation bilaterally, normal work of breathing GI: soft, nontender, nondistended, + BS MS: no deformity or atrophy  Skin: warm and dry Neuro:  Strength and sensation are intact Psych: euthymic mood, full affect    Labs    Chemistry  Recent Labs Lab  12/28/16 1857 12/28/16 1930 12/29/16 0241  NA 138 140 141  K 3.5 3.2* 4.0  CL 104 104 107  CO2 22 23 25   GLUCOSE 102* 104* 103*  BUN 7 7 5*  CREATININE 0.72 0.69 0.63  CALCIUM 9.4 9.0 8.7*  PROT  --  6.8  --   ALBUMIN  --  3.7  --   AST  --  18  --   ALT  --  15  --   ALKPHOS  --  57  --   BILITOT  --  0.6  --   GFRNONAA >60 >60 >60  GFRAA >60 >60 >60  ANIONGAP 12 13 9      Hematology  Recent Labs Lab 12/28/16 1857 12/28/16 1930 12/29/16 0241  WBC 10.4 10.3 8.5  RBC 4.67 4.38 4.11  HGB 14.5 13.5 12.8  HCT 43.9 40.4 38.5  MCV 94.0 92.2 93.7  MCH 31.0 30.8 31.1  MCHC 33.0 33.4 33.2  RDW 13.3 12.9 13.0  PLT 227 236 212    Cardiac Enzymes  Recent Labs Lab  12/28/16 1930  TROPONINI 0.13*     Recent Labs Lab 12/28/16 1906  TROPIPOC 0.04     BNPNo results for input(s): BNP, PROBNP in the last 168 hours.   DDimer No results for input(s): DDIMER in the last 168 hours.   Radiology    No results found.  Cardiac Studies   LHC  LM-2 lesion, 50 %stenosed.  LM-1 lesion, 20 %stenosed.  Ost 1st Diag lesion, 100 %stenosed.  Prox LAD to Mid LAD lesion, 80 %stenosed.  Mid LAD to Dist LAD lesion, 80 %stenosed.  Dist LAD lesion, 90 %stenosed.  3rd Mrg lesion, 40 %stenosed.  Ost 3rd Mrg lesion, 30 %stenosed.  Prox RCA lesion, 40 %stenosed.  Mid RCA lesion, 50 %stenosed.  Dist RCA lesion, 40 %stenosed.  RPDA lesion, 50 %stenosed  Patient Profile     61 y.o. female with a history of HTN, CAD s/p previous CABG x4 and stents, RBBB and fibromyalgia who presented to [facility] today via EMS as a code STEMI and taken emergently to cath lab for coronary angiography.   Assessment & Plan    1. NSTEMI: No chest pain today. Been walking around the unit without issue. Plan for discharge. Sharon Saunders discharge on Aspirin, Plavix, atorvastatin, imdur, metoprolol.   2. Hypertension: Well controlled. No changes  3. Hyperlipidemia:LDL>70. On atorvastatin.  Signed, Lyden Redner  Jorja Loa, MD  12/30/2016, 7:58 AM

## 2016-12-31 ENCOUNTER — Encounter (HOSPITAL_COMMUNITY): Payer: Self-pay | Admitting: Cardiovascular Disease

## 2016-12-31 LAB — POCT I-STAT, CHEM 8
BUN: 7 mg/dL (ref 6–20)
CHLORIDE: 103 mmol/L (ref 101–111)
CREATININE: 0.6 mg/dL (ref 0.44–1.00)
Calcium, Ion: 1.21 mmol/L (ref 1.15–1.40)
GLUCOSE: 107 mg/dL — AB (ref 65–99)
HEMATOCRIT: 39 % (ref 36.0–46.0)
HEMOGLOBIN: 13.3 g/dL (ref 12.0–15.0)
POTASSIUM: 3.1 mmol/L — AB (ref 3.5–5.1)
Sodium: 140 mmol/L (ref 135–145)
TCO2: 25 mmol/L (ref 0–100)

## 2016-12-31 LAB — POCT ACTIVATED CLOTTING TIME: ACTIVATED CLOTTING TIME: 164 s

## 2017-01-01 MED FILL — Nitroglycerin IV Soln 100 MCG/ML in D5W: INTRA_ARTERIAL | Qty: 10 | Status: AC

## 2017-01-01 MED FILL — Heparin Sodium (Porcine) 2 Unit/ML in Sodium Chloride 0.9%: INTRAMUSCULAR | Qty: 1000 | Status: AC

## 2017-01-17 ENCOUNTER — Ambulatory Visit: Payer: Self-pay | Admitting: Physician Assistant

## 2017-01-31 DIAGNOSIS — R1310 Dysphagia, unspecified: Secondary | ICD-10-CM | POA: Diagnosis not present

## 2017-01-31 DIAGNOSIS — K222 Esophageal obstruction: Secondary | ICD-10-CM | POA: Diagnosis not present

## 2017-01-31 DIAGNOSIS — I2510 Atherosclerotic heart disease of native coronary artery without angina pectoris: Secondary | ICD-10-CM | POA: Diagnosis not present

## 2017-02-05 ENCOUNTER — Ambulatory Visit (INDEPENDENT_AMBULATORY_CARE_PROVIDER_SITE_OTHER): Payer: Medicare Other | Admitting: Physician Assistant

## 2017-02-05 VITALS — BP 128/80 | HR 72 | Ht 63.0 in | Wt 187.2 lb

## 2017-02-05 DIAGNOSIS — Z79899 Other long term (current) drug therapy: Secondary | ICD-10-CM

## 2017-02-05 DIAGNOSIS — E785 Hyperlipidemia, unspecified: Secondary | ICD-10-CM

## 2017-02-05 DIAGNOSIS — I4510 Unspecified right bundle-branch block: Secondary | ICD-10-CM | POA: Diagnosis not present

## 2017-02-05 DIAGNOSIS — R1310 Dysphagia, unspecified: Secondary | ICD-10-CM | POA: Diagnosis not present

## 2017-02-05 DIAGNOSIS — I255 Ischemic cardiomyopathy: Secondary | ICD-10-CM

## 2017-02-05 DIAGNOSIS — I10 Essential (primary) hypertension: Secondary | ICD-10-CM | POA: Diagnosis not present

## 2017-02-05 DIAGNOSIS — I25810 Atherosclerosis of coronary artery bypass graft(s) without angina pectoris: Secondary | ICD-10-CM | POA: Diagnosis not present

## 2017-02-05 MED ORDER — METOPROLOL SUCCINATE ER 50 MG PO TB24: 50.0000 mg | ORAL_TABLET | Freq: Every day | ORAL | 5 refills | Status: DC

## 2017-02-05 MED ORDER — PANTOPRAZOLE SODIUM 40 MG PO TBEC: 40.0000 mg | DELAYED_RELEASE_TABLET | Freq: Every day | ORAL | 11 refills | Status: DC

## 2017-02-05 NOTE — Patient Instructions (Signed)
Medication Instructions:   STOP metoprolol tartrate  twice daily  START metoprolol succinate  DAILY  STOP omeprazole (prilosec)  START pantoprazole (protonix)  DAILY  Labwork:   Fasting lipid profile and liver function test in 2 months.  Testing/Procedures:  none  Follow-Up:  With Dr. Tresa Endo in 2-3 months  If you need a refill on your cardiac medications before your next appointment, please call your pharmacy.

## 2017-02-05 NOTE — Progress Notes (Addendum)
Cardiology Office Note    Date:  02/07/2017   ID:  Sharon Saunders, DOB [birth year], MRN [id number]  PCP:  Nonnie Done., MD  Cardiologist:  Dr. Tresa Endo Primary gastroenterologist: Dr. Edman Circle in [place]. Fax [phone]. Phone [phone]  Chief Complaint  Patient presents with  . Follow-up    seen for Dr. Tresa Endo.     History of Present Illness:  Sharon Saunders is a 61 y.o. female with PMH of HTN, RBBB, fibromyalgia and CAD s/p CABG x 4. She has not seen her primary cardiologist for many years due to financial issues. She recently presented to the hospital post code STEMI on 12/28/2016. Emergent cardiac catheterization performed 01/28/2017 showed EF 40-45%, 100% ostial D1, 80% proximal LAD, 80% mid LAD, 90% distal LAD lesion, 40-50% mid to distal RCA, 50% left main lesion. 2 vein graft markers were visualized in the aorta, however they were all occluded near the origin. The LIMA was injected but apparently this did not anastomose into any coronary vessel. Medical therapy was recommended. During this admission her troponin was mildly elevated at 0.13. Lipid panel obtained in the afternoon showed LDL of 128, HDL 29, cholesterol 216, triglycerides 297. Unclear if this is fasting though.  Patient presents today for cardiology office visit. She denies any chest discomfort, shortness of breath, lower extremity edema, orthopnea and PND. Given her LV dysfunction, I will consolidate her metoprolol titrated to Toprol-XL 50 mg daily. Given potential interaction between Prilosec and Plavix, I will transition her to Protonix 40 mg daily. Her main concern today is problems swallowing. She has a history of esophageal stricture, she is seeing Dr. Edman Circle of [facility], she says she likely will have esophageal dilatation procedure and there was no if she can come off of Plavix. I have discussed with Dr. Tresa Endo, since she never received a stent in the recent cardiac catheterization,  she is safe to come off Plavix for 7 days prior to the procedure and restart as soon as possible.   Past Medical History:  Diagnosis Date  . CAD (coronary artery disease)    a. s/p CABG in 1998;  b. s/p prior LAD stenting;  c. 12/2016 Cath: LM 20/50, LAD 50p, diffuse 8m ISR, 80d, 90apical, D1 100, LCX min irregs, OM3 30/40, RCA 40p, 79m, 40d, RPDA 50. EF 40-45%, mid antlat HK, focal apical aneurysmal segment.  . Fibromyalgia   . Hyperlipidemia   . Hypertension   . Ischemic cardiomyopathy    a. 12/2016 LV Gram: EF 40-45% w/ moderate mid anterolateral HK and a focal apical aneurysmal segment.  . Obesity   . RBBB     Past Surgical History:  Procedure Laterality Date  . CORONARY ARTERY BYPASS GRAFT    . LEFT HEART CATH AND CORONARY ANGIOGRAPHY N/A 12/28/2016   Procedure: LEFT HEART CATH AND CORONARY ANGIOGRAPHY;  Surgeon: Lennette Bihari, MD;  Location: [facility] INVASIVE CV LAB;  Service: Cardiovascular;  Laterality: N/A;    Current Medications: Outpatient Medications Prior to Visit  Medication Sig Dispense Refill  . acetaminophen (TYLENOL) 500 MG tablet Take 500-1,000 mg by mouth every 6 (six) hours as needed for headache (pain).    Marland Kitchen aspirin 81 MG chewable tablet Chew 1 tablet (81 mg total) by mouth daily. 30 tablet 6  . atorvastatin (LIPITOR) 80 MG tablet Take 1 tablet (80 mg total) by mouth daily at 6 PM. 30 tablet 6  . clopidogrel (PLAVIX) 75 MG tablet Take 1 tablet (75 mg  total) by mouth daily with breakfast. 30 tablet 6  . isosorbide mononitrate (IMDUR) 60 MG 24 hr tablet Take 1 tablet (60 mg total) by mouth daily. 30 tablet 6  . meclizine (ANTIVERT) 25 MG tablet Take 25 mg by mouth 3 (three) times daily as needed for dizziness.    . nitroGLYCERIN (NITROSTAT) 0.4 MG SL tablet Place 1 tablet (0.4 mg total) under the tongue every 5 (five) minutes as needed for chest pain. 25 tablet 3  . metoprolol tartrate (LOPRESSOR) 25 MG tablet Take 1 tablet (25 mg total) by mouth 2 (two) times daily. 60  tablet 6   No facility-administered medications prior to visit.      Allergies:   Sulfa antibiotics   Social History   Social History  . Marital status: Married    Spouse name: N/A  . Number of children: N/A  . Years of education: N/A   Social History Main Topics  . Smoking status: Former Smoker    Quit date: 12/28/1996  . Smokeless tobacco: Former Neurosurgeon  . Alcohol use No  . Drug use: No  . Sexual activity: Not Currently   Other Topics Concern  . None   Social History Narrative  . None     Family History:  The patient's family history is not on file.   ROS:   Please see the history of present illness.    ROS All other systems reviewed and are negative.   PHYSICAL EXAM:   VS:  BP 128/80   Pulse 72   Ht  (1.6 m)   Wt 187 lb 3.2 oz (84.9 kg)   BMI 33.16 kg/m    GEN: Well nourished, well developed, in no acute distress  HEENT: normal  Neck: no JVD, carotid bruits, or masses Cardiac: RRR; no murmurs, rubs, or gallops,no edema  Respiratory:  clear to auscultation bilaterally, normal work of breathing GI: soft, nontender, nondistended, + BS MS: no deformity or atrophy  Skin: warm and dry, no rash Neuro:  Alert and Oriented x 3, Strength and sensation are intact Psych: euthymic mood, full affect  Wt Readings from Last 3 Encounters:  02/05/17 187 lb 3.2 oz (84.9 kg)  12/29/16 194 lb 0.1 oz (88 kg)      Studies/Labs Reviewed:   EKG:  EKG is ordered today.  The ekg ordered today demonstrates Normal sinus rhythm, no significant ST-T wave changes.  Recent Labs: 12/28/2016: ALT 15 12/29/2016: BUN 5; Creatinine, Ser 0.63; Hemoglobin 12.8; Platelets 212; Potassium 4.0; Sodium 141   Lipid Panel    Component Value Date/Time   CHOL 216 (H) 12/28/2016 1930   TRIG 297 (H) 12/28/2016 1930   HDL 29 (L) 12/28/2016 1930   CHOLHDL 7.4 12/28/2016 1930   VLDL 59 (H) 12/28/2016 1930   LDLCALC 128 (H) 12/28/2016 1930    Additional studies/ records that were reviewed  today include:   Cath 12/28/2016 Conclusion     LM-2 lesion, 50 %stenosed.  LM-1 lesion, 20 %stenosed.  Ost 1st Diag lesion, 100 %stenosed.  Prox LAD to Mid LAD lesion, 80 %stenosed.  Mid LAD to Dist LAD lesion, 80 %stenosed.  Dist LAD lesion, 90 %stenosed.  3rd Mrg lesion, 40 %stenosed.  Ost 3rd Mrg lesion, 30 %stenosed.  Prox RCA lesion, 40 %stenosed.  Mid RCA lesion, 50 %stenosed.  Dist RCA lesion, 40 %stenosed.  RPDA lesion, 50 %stenosed.   Moderate global LV dysfunction with an ejection fraction of 40-45% with moderate hypocontractility involving the mid distal  anterolateral wall and focal aneurysmal apical segment.  Severe multivessel native CAD with 20% smooth distal left main tapering, 20 and 50% very proximal LAD stenoses, with diffuse 80% in-stent restenosis in the mid LAD.  Comment diffuse irregularity and narrowing of the mid distal LAD of 80% which reaches 90% apically and a small caliber; 30 and 50% stenoses in the circumflex marginal vessel; and diffusely irregular dominant RCA with stenoses of 40 1540% in the proximal, mid and mid-distal segment with 50% PDA stenosis.  There is no competitive filling from any graft to any vessel.  Two vein markers are visualized in the aorta.  It is uncertain as to where these grafts had gone and whether or not they were sequential grafts.  Both grafts are occluded at its origin.  The left internal mammary artery was injected but apparently this did not anastomose into any coronary vessel.  RECOMMENDATION: Medical therapy.  I will start patient on aspirin and Plavix.  Patient will be started on aggressive lipid-lowering therapy.  Nitrates will be added to medical regimen with beta blocker therapy with consideration of possible amlodipine.  We will try to obtain the surgical vascular records which are unavailable in Epic.      ASSESSMENT:    1. Coronary artery disease involving coronary bypass graft of native heart  without angina pectoris   2. RBBB   3. Hyperlipidemia LDL goal <70   4. Encounter for long-term (current) use of medications   5. Essential hypertension   6. Dysphagia, unspecified type   7. Ischemic cardiomyopathy      PLAN:  In order of problems listed above:  1. CAD s/p CABG: Previously placed vein graft occluded on recent cardiac catheterization, LIMA could not be located either. She is on medical therapy, aspirin, Plavix, Imdur.  2. ICM with EF 40%: Will consolidate metoprolol tartrate to Toprol-XL 50 mg daily  3. HLD: On Lipitor 80 mg daily, will need to order fasting lipid panel and LFTs on the next visit  4. HTN: Blood pressure stable on today's visit, consolidate metoprolol tartrate 25 mg twice a day to 50 mg daily  5. Dysphagia: Switching omeprazole to Protonix due to interaction with Plavix. She is seeing Dr. Edman Circle for consideration of possible esophageal dilatation, she can safely come off Plavix for 7 days prior to the procedure if needed. I have reviewed this with Dr. Tresa Endo as well.    Medication Adjustments/Labs and Tests Ordered: Current medicines are reviewed at length with the patient today.  Concerns regarding medicines are outlined above.  Medication changes, Labs and Tests ordered today are listed in the Patient Instructions below. Patient Instructions  Medication Instructions:   STOP metoprolol tartrate  twice daily  START metoprolol succinate  DAILY  STOP omeprazole (prilosec)  START pantoprazole (protonix)  DAILY  Labwork:   Fasting lipid profile and liver function test in 2 months.  Testing/Procedures:  none  Follow-Up:  With Dr. Tresa Endo in 2-3 months  If you need a refill on your cardiac medications before your next appointment, please call your pharmacy.      Ramond Dial, [place]  02/07/2017 6:40 AM    [facility] [address] [place], [place], [place]  [address] Phone: [phone]; Fax: [phone]

## 2017-02-07 ENCOUNTER — Encounter: Payer: Self-pay | Admitting: Physician Assistant

## 2017-02-08 ENCOUNTER — Telehealth: Payer: Self-pay | Admitting: *Deleted

## 2017-02-08 NOTE — Telephone Encounter (Signed)
Attempted to call patient, goes directly to automated recording indicating her VM box is not set up to receive messages.  Clearance notification sent to Dr. Urban Gibson office.

## 2017-02-08 NOTE — Telephone Encounter (Signed)
-----   Message from [place], [place] sent at 02/07/2017  7:00 AM EDT ----- Regarding: contact patient and GI office Discussed with Dr. Tresa Endo, it is safe to temporarily come off plavix for esophageal procedure. She must restart after the procedure as soon as possible. Please inform patient and Dr. Chales Abrahams. Contact info for Dr. Chales Abrahams in the note.   Thanks [employer]

## 2017-02-14 DIAGNOSIS — K222 Esophageal obstruction: Secondary | ICD-10-CM | POA: Diagnosis not present

## 2017-02-14 DIAGNOSIS — R1310 Dysphagia, unspecified: Secondary | ICD-10-CM | POA: Diagnosis not present

## 2017-02-14 DIAGNOSIS — K224 Dyskinesia of esophagus: Secondary | ICD-10-CM | POA: Diagnosis not present

## 2017-02-14 DIAGNOSIS — K219 Gastro-esophageal reflux disease without esophagitis: Secondary | ICD-10-CM | POA: Diagnosis not present

## 2017-02-14 DIAGNOSIS — K449 Diaphragmatic hernia without obstruction or gangrene: Secondary | ICD-10-CM | POA: Diagnosis not present

## 2017-02-21 DIAGNOSIS — K449 Diaphragmatic hernia without obstruction or gangrene: Secondary | ICD-10-CM | POA: Diagnosis not present

## 2017-02-21 DIAGNOSIS — Z79899 Other long term (current) drug therapy: Secondary | ICD-10-CM | POA: Diagnosis not present

## 2017-02-21 DIAGNOSIS — K219 Gastro-esophageal reflux disease without esophagitis: Secondary | ICD-10-CM | POA: Diagnosis not present

## 2017-02-21 DIAGNOSIS — M797 Fibromyalgia: Secondary | ICD-10-CM | POA: Diagnosis not present

## 2017-02-21 DIAGNOSIS — I25810 Atherosclerosis of coronary artery bypass graft(s) without angina pectoris: Secondary | ICD-10-CM | POA: Diagnosis not present

## 2017-02-21 DIAGNOSIS — R1310 Dysphagia, unspecified: Secondary | ICD-10-CM | POA: Diagnosis not present

## 2017-02-21 DIAGNOSIS — K222 Esophageal obstruction: Secondary | ICD-10-CM | POA: Diagnosis not present

## 2017-02-21 DIAGNOSIS — E785 Hyperlipidemia, unspecified: Secondary | ICD-10-CM | POA: Diagnosis not present

## 2017-02-21 DIAGNOSIS — M1990 Unspecified osteoarthritis, unspecified site: Secondary | ICD-10-CM | POA: Diagnosis not present

## 2017-02-21 DIAGNOSIS — Z7982 Long term (current) use of aspirin: Secondary | ICD-10-CM | POA: Diagnosis not present

## 2017-02-21 DIAGNOSIS — Z7902 Long term (current) use of antithrombotics/antiplatelets: Secondary | ICD-10-CM | POA: Diagnosis not present

## 2017-02-21 DIAGNOSIS — Z955 Presence of coronary angioplasty implant and graft: Secondary | ICD-10-CM | POA: Diagnosis not present

## 2017-02-21 DIAGNOSIS — I10 Essential (primary) hypertension: Secondary | ICD-10-CM | POA: Diagnosis not present

## 2017-02-21 DIAGNOSIS — I252 Old myocardial infarction: Secondary | ICD-10-CM | POA: Diagnosis not present

## 2017-02-21 DIAGNOSIS — Q393 Congenital stenosis and stricture of esophagus: Secondary | ICD-10-CM | POA: Diagnosis not present

## 2017-04-08 ENCOUNTER — Encounter: Payer: Self-pay | Admitting: *Deleted

## 2017-04-08 ENCOUNTER — Ambulatory Visit: Payer: Medicare Other | Admitting: Cardiovascular Disease

## 2017-08-12 ENCOUNTER — Other Ambulatory Visit: Payer: Self-pay | Admitting: Cardiovascular Disease

## 2017-08-12 MED ORDER — CLOPIDOGREL BISULFATE 75 MG PO TABS: 75.0000 mg | ORAL_TABLET | Freq: Every day | ORAL | 0 refills | Status: DC

## 2017-08-12 MED ORDER — ISOSORBIDE MONONITRATE ER 60 MG PO TB24: 60.0000 mg | ORAL_TABLET | Freq: Every day | ORAL | 0 refills | Status: DC

## 2017-08-12 MED ORDER — METOPROLOL SUCCINATE ER 50 MG PO TB24
50.0000 mg | ORAL_TABLET | Freq: Every day | ORAL | 0 refills | Status: DC
Start: 2017-08-12 — End: 2017-09-16

## 2017-08-12 NOTE — Telephone Encounter (Signed)
°*  STAT* If patient is at the pharmacy, call can be transferred to refill team.   1. Which medications need to be refilled? (please list name of each medication and dose if known) Plavix,Metoprolor and Imdur-pt has an appt in April  2. Which pharmacy/location (including street and city if local pharmacy) is medication to be sent to?[facility] [phone]  3. Do they need a 30 day or 90 day supply? 30 and refills

## 2017-09-16 ENCOUNTER — Ambulatory Visit (INDEPENDENT_AMBULATORY_CARE_PROVIDER_SITE_OTHER): Payer: Medicare Other | Admitting: Physician Assistant

## 2017-09-16 ENCOUNTER — Encounter: Payer: Self-pay | Admitting: Physician Assistant

## 2017-09-16 VITALS — BP 148/96 | HR 71 | Ht 64.0 in | Wt 194.0 lb

## 2017-09-16 DIAGNOSIS — I25810 Atherosclerosis of coronary artery bypass graft(s) without angina pectoris: Secondary | ICD-10-CM

## 2017-09-16 DIAGNOSIS — I255 Ischemic cardiomyopathy: Secondary | ICD-10-CM | POA: Diagnosis not present

## 2017-09-16 DIAGNOSIS — I4510 Unspecified right bundle-branch block: Secondary | ICD-10-CM

## 2017-09-16 DIAGNOSIS — E785 Hyperlipidemia, unspecified: Secondary | ICD-10-CM

## 2017-09-16 DIAGNOSIS — I10 Essential (primary) hypertension: Secondary | ICD-10-CM

## 2017-09-16 MED ORDER — ISOSORBIDE MONONITRATE ER 60 MG PO TB24: 60.0000 mg | ORAL_TABLET | Freq: Every day | ORAL | 6 refills | Status: DC

## 2017-09-16 MED ORDER — METOPROLOL SUCCINATE ER 50 MG PO TB24: 50.0000 mg | ORAL_TABLET | Freq: Every day | ORAL | 6 refills | Status: DC

## 2017-09-16 MED ORDER — CLOPIDOGREL BISULFATE 75 MG PO TABS: 75.0000 mg | ORAL_TABLET | Freq: Every day | ORAL | 6 refills | Status: DC

## 2017-09-16 NOTE — Patient Instructions (Addendum)
Medication Instructions:  Your physician recommends that you continue on your current medications as directed. Please refer to the Current Medication list given to you today.  Labwork: Your physician recommends that you return for lab work in: FASTING LIPIDS, LFT  Testing/Procedures: NONE  Follow-Up: Your physician recommends that you schedule a follow-up appointment in: 3-4 MONTHS WITH DR KELLY ONLY.  Any Other Special Instructions Will Be Listed Below (If Applicable). Common Over the Counter Allergy Medication include: Claritin or Zyrtec or Allegra. Any one of them are safe to take along with your cardiac medications.  If you need a refill on your cardiac medications before your next appointment, please call your pharmacy.

## 2017-09-16 NOTE — Progress Notes (Signed)
Cardiology Office Note    Date:  09/16/2017   ID:  Sharon Saunders, DOB 09-Sep-1955, MRN [id number]  PCP:  Nonnie Done., MD  Cardiologist:  Dr. Tresa Endo Primary gastroenterologist: Dr. Edman Circle in [place]. Fax [phone]. Phone [phone]  Chief Complaint  Patient presents with  . Follow-up    7 months followup, seen for Dr. Tresa Endo    History of Present Illness:  Sharon Saunders is a 62 y.o. female with PMH of HTN, HLD, RBBB, fibromyalgia and CAD s/p CABG x 4 and ICM with baseline EF 40-45%. She has not seen her primary cardiologist for many years due to financial issues. She presented to the hospital post code STEMI on 12/28/2016. Emergent cardiac catheterization performed 01/28/2017 showed EF 40-45%, 100% ostial D1, 80% proximal LAD, 80% mid LAD, 90% distal LAD lesion, 40-50% mid to distal RCA, 50% left main lesion, 2 vein graft markers were visualized in the aorta, however they were all occluded near the origin. The LIMA was injected but apparently this did not anastomose into any coronary vessel. Medical therapy was recommended. During this admission her troponin was mildly elevated at 0.13. Lipid panel obtained in the afternoon showed LDL of 128, HDL 29, cholesterol 216, triglycerides 297. Unclear if this is fasting though.  I last saw the patient on 02/05/2017, I consolidated her metoprolol tartrate to Toprol-XL 50 mg daily.  I also transitioned her to Protonix 40 mg daily given the potential interaction between Prilosec and Plavix.  Patient presents today for very delayed cardiology office visit.  She was unable to make it to Dr. Landry Dyke follow-up in November 2018.  She is not fasting today either.  She has ran out of her metoprolol, Imdur, and Plavix.  I will refill all of her medications.  Otherwise she denies any dyspnea on exertion, orthopnea or PND.  She had what she described as a gas pain while sitting at church a month ago it occurred transiently twice and the quickly resolved  with nitroglycerin.  She has not had any further chest pain even with doing physical activity at home for the past 3 weeks.  I will hold off on ischemic work-up at this time.  She is aware that she need to contact us if she started having worsening degree of chest pain, increasing frequency or exertional symptoms.  Her main concern is actually seasonal allergies, I recommended some the over-the-counter Claritin or Zyrtec or Allegra.   Past Medical History:  Diagnosis Date  . CAD (coronary artery disease)    a. s/p CABG in 1998;  b. s/p prior LAD stenting;  c. 12/2016 Cath: LM 20/50, LAD 50p, diffuse 60m ISR, 80d, 90apical, D1 100, LCX min irregs, OM3 30/40, RCA 40p, 29m, 40d, RPDA 50. EF 40-45%, mid antlat HK, focal apical aneurysmal segment.  . Fibromyalgia   . Hyperlipidemia   . Hypertension   . Ischemic cardiomyopathy    a. 12/2016 LV Gram: EF 40-45% w/ moderate mid anterolateral HK and a focal apical aneurysmal segment.  . Obesity   . RBBB     Past Surgical History:  Procedure Laterality Date  . CORONARY ARTERY BYPASS GRAFT    . LEFT HEART CATH AND CORONARY ANGIOGRAPHY N/A 12/28/2016   Procedure: LEFT HEART CATH AND CORONARY ANGIOGRAPHY;  Surgeon: Lennette Bihari, MD;  Location: [facility];  Service: Cardiovascular;  Laterality: N/A;    Current Medications: Outpatient Medications Prior to Visit  Medication Sig Dispense Refill  . acetaminophen (TYLENOL)  500 MG tablet Take 500-1,000 mg by mouth every 6 (six) hours as needed for headache (pain).    Marland Kitchen aspirin 81 MG chewable tablet Chew 1 tablet (81 mg total) by mouth daily. 30 tablet 6  . atorvastatin (LIPITOR) 80 MG tablet Take 1 tablet (80 mg total) by mouth daily at 6 PM. 30 tablet 6  . meclizine (ANTIVERT) 25 MG tablet Take 25 mg by mouth 3 (three) times daily as needed for dizziness.    . nitroGLYCERIN (NITROSTAT) 0.4 MG SL tablet Place 1 tablet (0.4 mg total) under the tongue every 5 (five) minutes as needed for chest pain.  25 tablet 3  . pantoprazole (PROTONIX) 40 MG tablet Take 1 tablet (40 mg total) by mouth daily. 30 tablet 11  . clopidogrel (PLAVIX) 75 MG tablet Take 1 tablet (75 mg total) by mouth daily with breakfast. 30 tablet 0  . isosorbide mononitrate (IMDUR) 60 MG 24 hr tablet Take 1 tablet (60 mg total) by mouth daily. 30 tablet 0  . metoprolol succinate (TOPROL-XL) 50 MG 24 hr tablet Take 1 tablet (50 mg total) by mouth daily. Take with or immediately following a meal. 30 tablet 0   No facility-administered medications prior to visit.      Allergies:   Sulfa antibiotics   Social History   Socioeconomic History  . Marital status: Married    Spouse name: Not on file  . Number of children: Not on file  . Years of education: Not on file  . Highest education level: Not on file  Occupational History  . Not on file  Social Needs  . Financial resource strain: Not on file  . Food insecurity:    Worry: Not on file    Inability: Not on file  . Transportation needs:    Medical: Not on file    Non-medical: Not on file  Tobacco Use  . Smoking status: Former Smoker    Last attempt to quit: 12/28/1996    Years since quitting: 20.7  . Smokeless tobacco: Former Engineer, water and Sexual Activity  . Alcohol use: No  . Drug use: No  . Sexual activity: Not Currently  Lifestyle  . Physical activity:    Days per week: Not on file    Minutes per session: Not on file  . Stress: Not on file  Relationships  . Social connections:    Talks on phone: Not on file    Gets together: Not on file    Attends religious service: Not on file    Active member of club or organization: Not on file    Attends meetings of clubs or organizations: Not on file    Relationship status: Not on file  Other Topics Concern  . Not on file  Social History Narrative  . Not on file     Family History:  The patient's family history is not on file.   ROS:   Please see the history of present illness.    ROS All other  systems reviewed and are negative.   PHYSICAL EXAM:   VS:  BP (!) 148/96 (BP Location: Left Arm, Patient Position: Sitting, Cuff Size: Large)   Pulse 71   Ht  (1.626 m)   Wt 194 lb (88 kg)   BMI 33.30 kg/m    GEN: Well nourished, well developed, in no acute distress  HEENT: normal  Neck: no JVD, carotid bruits, or masses Cardiac: RRR; no murmurs, rubs, or gallops,no edema  Respiratory:  clear to auscultation bilaterally, normal work of breathing GI: soft, nontender, nondistended, + BS MS: no deformity or atrophy  Skin: warm and dry, no rash Neuro:  Alert and Oriented x 3, Strength and sensation are intact Psych: euthymic mood, full affect  Wt Readings from Last 3 Encounters:  09/16/17 194 lb (88 kg)  02/05/17 187 lb 3.2 oz (84.9 kg)  12/29/16 194 lb 0.1 oz (88 kg)      Studies/Labs Reviewed:   EKG:  EKG is ordered today.  The ekg ordered today demonstrates normal sinus rhythm, no significant ST-T wave changes  Recent Labs: 12/28/2016: ALT 15 12/29/2016: BUN 5; Creatinine, Ser 0.63; Hemoglobin 12.8; Platelets 212; Potassium 4.0; Sodium 141   Lipid Panel    Component Value Date/Time   CHOL 216 (H) 12/28/2016 1930   TRIG 297 (H) 12/28/2016 1930   HDL 29 (L) 12/28/2016 1930   CHOLHDL 7.4 12/28/2016 1930   VLDL 59 (H) 12/28/2016 1930   LDLCALC 128 (H) 12/28/2016 1930    Additional studies/ records that were reviewed today include:   Cath 12/28/2016 Conclusion     LM-2 lesion, 50 %stenosed.  LM-1 lesion, 20 %stenosed.  Ost 1st Diag lesion, 100 %stenosed.  Prox LAD to Mid LAD lesion, 80 %stenosed.  Mid LAD to Dist LAD lesion, 80 %stenosed.  Dist LAD lesion, 90 %stenosed.  3rd Mrg lesion, 40 %stenosed.  Ost 3rd Mrg lesion, 30 %stenosed.  Prox RCA lesion, 40 %stenosed.  Mid RCA lesion, 50 %stenosed.  Dist RCA lesion, 40 %stenosed.  RPDA lesion, 50 %stenosed.  Moderate global LV dysfunction with an ejection fraction of 40-45% with moderate  hypocontractility involving the mid distal anterolateral wall and focal aneurysmal apical segment.  Severe multivessel native CAD with 20% smooth distal left main tapering, 20 and 50% very proximal LAD stenoses, with diffuse 80% in-stent restenosis in the mid LAD. Comment diffuse irregularity and narrowing of the mid distal LAD of 80% which reaches 90% apically and a small caliber; 30 and 50% stenoses in the circumflex marginal vessel; and diffusely irregular dominant RCA with stenoses of 40 1540% in the proximal, mid and mid-distal segment with 50% PDA stenosis. There is no competitive filling from any graft to any vessel.  Two vein markers are visualized in the aorta. It is uncertain as to where these grafts had gone and whether or not they were sequential grafts. Both grafts are occluded at its origin.  The left internal mammary artery was injected but apparently this did not anastomose into any coronary vessel.  RECOMMENDATION: Medical therapy. I will start patient on aspirin and Plavix. Patient will be started on aggressive lipid-lowering therapy. Nitrates will be added to medical regimen with beta blocker therapy with consideration of possible amlodipine. We will try to obtain the surgical vascular records which are unavailable in Epic.     ASSESSMENT:    1. Coronary artery disease involving coronary bypass graft of native heart without angina pectoris   2. Essential hypertension   3. Hyperlipidemia, unspecified hyperlipidemia type   4. RBBB   5. Ischemic cardiomyopathy      PLAN:  In order of problems listed above:  1. CAD s/p CABG: Last cardiac catheterization August 2018, medical therapy was recommended.  Previous vein graft could not be located on cardiac catheterization.  On aspirin and Plavix.  She has ran out of Plavix, will refill.  Also need high-dose statin.  2. Hypertension: Blood pressure elevated today as she ran out of her blood pressure  medication for the  past few days, I will refill  3. Hyperlipidemia: She is overdue for fasting lipid panel and LFT, unfortunately she is not fasting today.  She will need to return another day for fasting lab work  4. Ischemic cardiomyopathy: Baseline EF 40%, on Toprol-XL, may consider addition of losartan follow-up if her blood pressure remains high.  Medication Adjustments/Labs and Tests Ordered: Current medicines are reviewed at length with the patient today.  Concerns regarding medicines are outlined above.  Medication changes, Labs and Tests ordered today are listed in the Patient Instructions below. Patient Instructions  Medication Instructions:  Your physician recommends that you continue on your current medications as directed. Please refer to the Current Medication list given to you today.  Labwork: Your physician recommends that you return for lab work in: FASTING LIPIDS, LFT  Testing/Procedures: NONE  Follow-Up: Your physician recommends that you schedule a follow-up appointment in: 3-4 MONTHS WITH DR KELLY ONLY.  Any Other Special Instructions Will Be Listed Below (If Applicable). Common Over the Counter Allergy Medication include: Claritin or Zyrtec or Allegra. Any one of them are safe to take along with your cardiac medications.  If you need a refill on your cardiac medications before your next appointment, please call your pharmacy.         Ramond Dial, [place]  09/16/2017 2:04 PM    [facility] [address] [place], [place], [place]  [address] Phone: [phone]; Fax: [phone]

## 2018-02-05 ENCOUNTER — Ambulatory Visit: Payer: Medicare Other | Admitting: Cardiovascular Disease

## 2018-02-06 ENCOUNTER — Encounter: Payer: Self-pay | Admitting: *Deleted

## 2018-04-29 DIAGNOSIS — W540XXA Bitten by dog, initial encounter: Secondary | ICD-10-CM | POA: Diagnosis not present

## 2018-04-29 DIAGNOSIS — Z23 Encounter for immunization: Secondary | ICD-10-CM | POA: Diagnosis not present

## 2018-04-29 DIAGNOSIS — S61451A Open bite of right hand, initial encounter: Secondary | ICD-10-CM | POA: Diagnosis not present

## 2018-07-24 ENCOUNTER — Other Ambulatory Visit: Payer: Self-pay | Admitting: Physician Assistant

## 2018-09-21 ENCOUNTER — Other Ambulatory Visit: Payer: Self-pay | Admitting: Physician Assistant

## 2019-01-27 ENCOUNTER — Other Ambulatory Visit: Payer: Self-pay | Admitting: Physician Assistant

## 2019-07-30 ENCOUNTER — Emergency Department (HOSPITAL_COMMUNITY): Payer: Medicare HMO

## 2019-07-30 ENCOUNTER — Inpatient Hospital Stay (HOSPITAL_COMMUNITY)
Admission: EM | Admit: 2019-07-30 | Discharge: 2019-08-01 | DRG: 247 | Disposition: A | Discharge status: Home / Self Care | Payer: Medicare HMO | Attending: Cardiology | Admitting: Cardiology

## 2019-07-30 ENCOUNTER — Telehealth: Payer: Self-pay | Admitting: Physician Assistant

## 2019-07-30 ENCOUNTER — Other Ambulatory Visit: Payer: Self-pay

## 2019-07-30 ENCOUNTER — Encounter (HOSPITAL_COMMUNITY): Payer: Self-pay | Admitting: Physician Assistant

## 2019-07-30 DIAGNOSIS — I25119 Atherosclerotic heart disease of native coronary artery with unspecified angina pectoris: Secondary | ICD-10-CM | POA: Diagnosis present

## 2019-07-30 DIAGNOSIS — I214 Non-ST elevation (NSTEMI) myocardial infarction: Principal | ICD-10-CM

## 2019-07-30 DIAGNOSIS — Z91120 Patient's intentional underdosing of medication regimen due to financial hardship: Secondary | ICD-10-CM

## 2019-07-30 DIAGNOSIS — R0789 Other chest pain: Secondary | ICD-10-CM | POA: Diagnosis not present

## 2019-07-30 DIAGNOSIS — I25700 Atherosclerosis of coronary artery bypass graft(s), unspecified, with unstable angina pectoris: Secondary | ICD-10-CM | POA: Diagnosis not present

## 2019-07-30 DIAGNOSIS — I10 Essential (primary) hypertension: Secondary | ICD-10-CM | POA: Diagnosis not present

## 2019-07-30 DIAGNOSIS — I255 Ischemic cardiomyopathy: Secondary | ICD-10-CM | POA: Diagnosis present

## 2019-07-30 DIAGNOSIS — E669 Obesity, unspecified: Secondary | ICD-10-CM | POA: Diagnosis present

## 2019-07-30 DIAGNOSIS — R079 Chest pain, unspecified: Secondary | ICD-10-CM | POA: Diagnosis not present

## 2019-07-30 DIAGNOSIS — Z6833 Body mass index (BMI) 33.0-33.9, adult: Secondary | ICD-10-CM

## 2019-07-30 DIAGNOSIS — I2510 Atherosclerotic heart disease of native coronary artery without angina pectoris: Secondary | ICD-10-CM | POA: Diagnosis present

## 2019-07-30 DIAGNOSIS — R7989 Other specified abnormal findings of blood chemistry: Secondary | ICD-10-CM | POA: Diagnosis not present

## 2019-07-30 DIAGNOSIS — Z882 Allergy status to sulfonamides status: Secondary | ICD-10-CM

## 2019-07-30 DIAGNOSIS — T50916A Underdosing of multiple unspecified drugs, medicaments and biological substances, initial encounter: Secondary | ICD-10-CM | POA: Diagnosis present

## 2019-07-30 DIAGNOSIS — Z7982 Long term (current) use of aspirin: Secondary | ICD-10-CM

## 2019-07-30 DIAGNOSIS — R072 Precordial pain: Secondary | ICD-10-CM | POA: Diagnosis not present

## 2019-07-30 DIAGNOSIS — E785 Hyperlipidemia, unspecified: Secondary | ICD-10-CM | POA: Diagnosis present

## 2019-07-30 DIAGNOSIS — Z888 Allergy status to other drugs, medicaments and biological substances status: Secondary | ICD-10-CM

## 2019-07-30 DIAGNOSIS — Z7902 Long term (current) use of antithrombotics/antiplatelets: Secondary | ICD-10-CM

## 2019-07-30 DIAGNOSIS — Z951 Presence of aortocoronary bypass graft: Secondary | ICD-10-CM

## 2019-07-30 DIAGNOSIS — Z20822 Contact with and (suspected) exposure to covid-19: Secondary | ICD-10-CM | POA: Diagnosis not present

## 2019-07-30 DIAGNOSIS — Z599 Problem related to housing and economic circumstances, unspecified: Secondary | ICD-10-CM

## 2019-07-30 DIAGNOSIS — R778 Other specified abnormalities of plasma proteins: Secondary | ICD-10-CM

## 2019-07-30 DIAGNOSIS — M797 Fibromyalgia: Secondary | ICD-10-CM | POA: Diagnosis present

## 2019-07-30 DIAGNOSIS — Z955 Presence of coronary angioplasty implant and graft: Secondary | ICD-10-CM

## 2019-07-30 DIAGNOSIS — I4510 Unspecified right bundle-branch block: Secondary | ICD-10-CM | POA: Diagnosis present

## 2019-07-30 DIAGNOSIS — Z79899 Other long term (current) drug therapy: Secondary | ICD-10-CM

## 2019-07-30 DIAGNOSIS — R0902 Hypoxemia: Secondary | ICD-10-CM | POA: Diagnosis not present

## 2019-07-30 HISTORY — DX: Unspecified osteoarthritis, unspecified site: M19.90

## 2019-07-30 HISTORY — DX: Non-ST elevation (NSTEMI) myocardial infarction: I21.4

## 2019-07-30 HISTORY — DX: Hyperlipidemia, unspecified: E78.5

## 2019-07-30 LAB — HEPATIC FUNCTION PANEL
ALT: 18 U/L (ref 0–44)
AST: 25 U/L (ref 15–41)
Albumin: 4 g/dL (ref 3.5–5.0)
Alkaline Phosphatase: 83 U/L (ref 38–126)
Bilirubin, Direct: 0.1 mg/dL (ref 0.0–0.2)
Indirect Bilirubin: 0.4 mg/dL (ref 0.3–0.9)
Total Bilirubin: 0.5 mg/dL (ref 0.3–1.2)
Total Protein: 7 g/dL (ref 6.5–8.1)

## 2019-07-30 LAB — CBC
HCT: 42.4 % (ref 36.0–46.0)
Hemoglobin: 14.1 g/dL (ref 12.0–15.0)
MCH: 31.9 pg (ref 26.0–34.0)
MCHC: 33.3 g/dL (ref 30.0–36.0)
MCV: 95.9 fL (ref 80.0–100.0)
Platelets: 216 10*3/uL (ref 150–400)
RBC: 4.42 MIL/uL (ref 3.87–5.11)
RDW: 12.4 % (ref 11.5–15.5)
WBC: 7.2 10*3/uL (ref 4.0–10.5)
nRBC: 0 % (ref 0.0–0.2)

## 2019-07-30 LAB — POC SARS CORONAVIRUS 2 AG -  ED: SARS Coronavirus 2 Ag: NEGATIVE

## 2019-07-30 LAB — BASIC METABOLIC PANEL
Anion gap: 13 (ref 5–15)
BUN: 7 mg/dL — ABNORMAL LOW (ref 8–23)
CO2: 24 mmol/L (ref 22–32)
Calcium: 9.1 mg/dL (ref 8.9–10.3)
Chloride: 103 mmol/L (ref 98–111)
Creatinine, Ser: 0.77 mg/dL (ref 0.44–1.00)
GFR calc Af Amer: >60 mL/min (ref >60)
GFR calc non Af Amer: >60 mL/min (ref >60)
Glucose, Bld: 117 mg/dL — ABNORMAL HIGH (ref 70–99)
Potassium: 3.6 mmol/L (ref 3.5–5.1)
Sodium: 140 mmol/L (ref 135–145)

## 2019-07-30 LAB — RESPIRATORY PANEL BY RT PCR (FLU A&B, COVID)
Influenza A by PCR: NEGATIVE
Influenza B by PCR: NEGATIVE
SARS Coronavirus 2 by RT PCR: NEGATIVE

## 2019-07-30 LAB — TROPONIN I (HIGH SENSITIVITY)
Troponin I (High Sensitivity): 128 ng/L (ref <18)
Troponin I (High Sensitivity): 1207 ng/L (ref <18)
Troponin I (High Sensitivity): 4216 ng/L (ref <18)

## 2019-07-30 LAB — LIPASE, BLOOD: Lipase: 21 U/L (ref 11–51)

## 2019-07-30 MED ORDER — HEPARIN BOLUS VIA INFUSION
4000.0000 [IU] | Freq: Once | INTRAVENOUS | Status: AC
  Administered 2019-07-30: 4000 [IU] via INTRAVENOUS
  Filled 2019-07-30: qty 4000

## 2019-07-30 MED ORDER — METOPROLOL SUCCINATE ER 50 MG PO TB24
50.0000 mg | ORAL_TABLET | Freq: Every day | ORAL | Status: DC
  Administered 2019-07-30 – 2019-07-31 (×2): 50 mg via ORAL
  Filled 2019-07-30: qty 2
  Filled 2019-07-30 (×2): qty 1

## 2019-07-30 MED ORDER — PANTOPRAZOLE SODIUM 40 MG PO TBEC
40.0000 mg | DELAYED_RELEASE_TABLET | Freq: Every day | ORAL | Status: DC
  Administered 2019-07-30 – 2019-08-01 (×3): 40 mg via ORAL
  Filled 2019-07-30 (×3): qty 1

## 2019-07-30 MED ORDER — NITROGLYCERIN IN D5W 200-5 MCG/ML-% IV SOLN
0.0000 ug/min | INTRAVENOUS | Status: DC
  Administered 2019-07-30: 5 ug/min via INTRAVENOUS
  Filled 2019-07-30: qty 250

## 2019-07-30 MED ORDER — ASPIRIN 81 MG PO CHEW
81.0000 mg | CHEWABLE_TABLET | Freq: Every day | ORAL | Status: DC
  Administered 2019-07-31 – 2019-08-01 (×2): 81 mg via ORAL
  Filled 2019-07-30 (×2): qty 1

## 2019-07-30 MED ORDER — CLOPIDOGREL BISULFATE 75 MG PO TABS
75.0000 mg | ORAL_TABLET | Freq: Every day | ORAL | Status: DC
  Administered 2019-07-31 – 2019-08-01 (×2): 75 mg via ORAL
  Filled 2019-07-30 (×2): qty 1

## 2019-07-30 MED ORDER — CLOPIDOGREL BISULFATE 300 MG PO TABS
300.0000 mg | ORAL_TABLET | Freq: Once | ORAL | Status: AC
  Administered 2019-07-30: 300 mg via ORAL
  Filled 2019-07-30: qty 1

## 2019-07-30 MED ORDER — HEPARIN (PORCINE) 25000 UT/250ML-% IV SOLN
900.0000 [IU]/h | INTRAVENOUS | Status: DC
  Administered 2019-07-30: 900 [IU]/h via INTRAVENOUS
  Filled 2019-07-30: qty 250

## 2019-07-30 MED ORDER — ONDANSETRON HCL 4 MG/2ML IJ SOLN
4.0000 mg | Freq: Four times a day (QID) | INTRAMUSCULAR | Status: DC | PRN
  Administered 2019-08-01: 4 mg via INTRAVENOUS
  Filled 2019-07-30: qty 2

## 2019-07-30 MED ORDER — SODIUM CHLORIDE 0.9% FLUSH
3.0000 mL | Freq: Once | INTRAVENOUS | Status: AC
  Administered 2019-07-30: 3 mL via INTRAVENOUS

## 2019-07-30 MED ORDER — MECLIZINE HCL 25 MG PO TABS: 25.0000 mg | ORAL_TABLET | Freq: Three times a day (TID) | ORAL | Status: DC | PRN

## 2019-07-30 MED ORDER — ZOLPIDEM TARTRATE 5 MG PO TABS: 5.0000 mg | ORAL_TABLET | Freq: Every evening | ORAL | Status: DC | PRN

## 2019-07-30 MED ORDER — ATORVASTATIN CALCIUM 80 MG PO TABS
80.0000 mg | ORAL_TABLET | Freq: Every day | ORAL | Status: DC
  Administered 2019-07-31: 80 mg via ORAL
  Filled 2019-07-30: qty 1

## 2019-07-30 MED ORDER — NITROGLYCERIN 0.4 MG SL SUBL: 0.4000 mg | SUBLINGUAL_TABLET | SUBLINGUAL | Status: DC | PRN

## 2019-07-30 MED ORDER — ACETAMINOPHEN 325 MG PO TABS
650.0000 mg | ORAL_TABLET | ORAL | Status: DC | PRN
  Administered 2019-07-31: 325 mg via ORAL
  Administered 2019-07-31 – 2019-08-01 (×3): 650 mg via ORAL
  Filled 2019-07-30 (×4): qty 2

## 2019-07-30 MED ORDER — METOPROLOL SUCCINATE ER 25 MG PO TB24: 50.0000 mg | ORAL_TABLET | Freq: Every day | ORAL | Status: DC

## 2019-07-30 NOTE — ED Triage Notes (Signed)
Pt. Stated, her pain is increasing, pt. Going to 44.

## 2019-07-30 NOTE — H&P (Addendum)
Cardiology Admission History and Physical:   Patient ID: Sharon Saunders; MRN: [id number]; DOB: 03/21/1956   Admission date: 07/30/2019  Primary Care Provider: Nonnie DoneSlatosky, John J., MD Primary Cardiologist: Nicki Guadalajarahomas Kelly, MD 12/28/2016 Primary Electrophysiologist: Regan LemmingWill Martin Camnitz, MD  12/30/2016  Chief Complaint:  NSTEMI  Patient Profile:   Sharon Saunders is a 64 y.o. female with a history of CABG 1998, LAD stent, HTN, HLD, ICM w/ EF 40-45%, RBBB, fibromyalgia  History of Present Illness:   Sharon Saunders had financial issues after she became a widow. She was not able to afford her meds or MD visits. No rx in 6 months.   Sharon Saunders was in her USOH until today. She was sitting on the couch and started having SSCP. Has not had CP in years. Her CP kept getting worse, up to 9/10. She was a little SOB, no N&V or diaphoresis. She did not have nitro.  At first, she thought it was indigestion because it made her feel like she needed to burp. The pain would wax and wane, but did not resolve.   She called EMS and came to the ER, sx improved w/ ASA and SL NTG.  The pain has come and gone since she has been in the ER, but she is currently pain-free.  She has had indigestion in the past, but this was different and much worse.  She has not had recent exertional chest pain or shortness of breath.   Past Medical History:  Diagnosis Date  . CAD (coronary artery disease)    a. s/p CABG in 1998;  b. s/p prior LAD stenting;  c. 12/2016 Cath: LM 20/50, LAD 50p, diffuse 1634m ISR, 80d, 90apical, D1 100, LCX min irregs, OM3 30/40, RCA 40p, 6156m, 40d, RPDA 50. EF 40-45%, mid antlat HK, focal apical aneurysmal segment.  . Fibromyalgia   . Hyperlipidemia   . Hypertension   . Ischemic cardiomyopathy    a. 12/2016 LV Gram: EF 40-45% w/ moderate mid anterolateral HK and a focal apical aneurysmal segment.  . NSTEMI (non-ST elevated myocardial infarction) (HCC) 07/30/2019  . Obesity   . RBBB     Past Surgical  History:  Procedure Laterality Date  . CORONARY ARTERY BYPASS GRAFT    . LEFT HEART CATH AND CORONARY ANGIOGRAPHY N/A 12/28/2016   Procedure: LEFT HEART CATH AND CORONARY ANGIOGRAPHY;  Surgeon: Lennette BihariKelly, Thomas A, MD;  Location: [facility];  Service: Cardiovascular;  Laterality: N/A;     Medications Prior to Admission: Prior to Admission medications   Medication Sig Start Date End Date Taking? Authorizing Provider  acetaminophen (TYLENOL) 500 MG tablet Take 500-1,000 mg by mouth every 6 (six) hours as needed for headache (pain).    [provider]  aspirin 81 MG chewable tablet Chew 1 tablet (81 mg total) by mouth daily. 12/30/16   Creig HinesBerge, Christopher Ronald, NP  atorvastatin (LIPITOR) 80 MG tablet Take 1 tablet (80 mg total) by mouth daily at 6 PM. 12/30/16   Creig HinesBerge, Christopher Ronald, NP  clopidogrel (PLAVIX) 75 MG tablet Take 1 tablet by mouth once daily with breakfast 01/28/19   Lennette BihariKelly, Thomas A, MD  isosorbide mononitrate (IMDUR) 60 MG 24 hr tablet Take 1 tablet (60 mg total) by mouth daily. 01/28/19   Lennette BihariKelly, Thomas A, MD  meclizine (ANTIVERT) 25 MG tablet Take 25 mg by mouth 3 (three) times daily as needed for dizziness.    [provider]  metoprolol succinate (TOPROL-XL) 50 MG 24 hr tablet Take  1 tablet (50 mg total) by mouth daily. 01/28/19   Troy Sine, MD  nitroGLYCERIN (NITROSTAT) 0.4 MG SL tablet Place 1 tablet (0.4 mg total) under the tongue every 5 (five) minutes as needed for chest pain. 12/30/16   Theora Gianotti, NP  pantoprazole (PROTONIX) 40 MG tablet Take 1 tablet (40 mg total) by mouth daily. 02/05/17   Almyra Deforest, PA     Allergies:    Allergies  Allergen Reactions  . Phenergan [Promethazine]   . Sulfa Antibiotics Rash    Social History:   Social History   Socioeconomic History  . Marital status: Married    Spouse name: Not on file  . Number of children: Not on file  . Years of education: Not on file  . Highest education level: Not on  file  Occupational History  . Not on file  Tobacco Use  . Smoking status: Former Smoker    Quit date: 12/28/1996    Years since quitting: 22.6  . Smokeless tobacco: Former Network engineer and Sexual Activity  . Alcohol use: No  . Drug use: No  . Sexual activity: Not Currently  Other Topics Concern  . Not on file  Social History Narrative  . Not on file   Social Determinants of Health   Financial Resource Strain:   . Difficulty of Paying Living Expenses:   Food Insecurity:   . Worried About Charity fundraiser in the Last Year:   . Arboriculturist in the Last Year:   Transportation Needs:   . Film/video editor (Medical):   Marland Kitchen Lack of Transportation (Non-Medical):   Physical Activity:   . Days of Exercise per Week:   . Minutes of Exercise per Session:   Stress:   . Feeling of Stress :   Social Connections:   . Frequency of Communication with Friends and Family:   . Frequency of Social Gatherings with Friends and Family:   . Attends Religious Services:   . Active Member of Clubs or Organizations:   . Attends Archivist Meetings:   Marland Kitchen Marital Status:   Intimate Partner Violence:   . Fear of Current or Ex-Partner:   . Emotionally Abused:   Marland Kitchen Physically Abused:   . Sexually Abused:     Family History:  The patient's family history is not on file.   The patient She indicated that her mother is deceased. She indicated that her father is deceased.    ROS:  Please see the history of present illness.  All other ROS reviewed and negative.     Physical Exam/Data:   Vitals:   07/30/19 1830 07/30/19 1845 07/30/19 1900 07/30/19 1923  BP: (!) 152/94 (!) 149/99    Pulse: 82 77    Resp: 20 13    Temp:      TempSrc:      SpO2: 99% 97%    Weight:   90.3 kg 90.3 kg  Height:    5\' 4"  (1.626 m)   No intake or output data in the 24 hours ending 07/30/19 1952 Filed Weights   07/30/19 1900 07/30/19 1923  Weight: 90.3 kg 90.3 kg   Body mass index is 34.17  kg/m.  General:  Well nourished, well developed, female in no acute distress HEENT: normal Lymph: no adenopathy Neck:  JVD not elevated Endocrine:  No thryomegaly Vascular: No carotid bruits; 4/4 extremity pulses 2+ bilaterally  Cardiac:  normal S1, S2; RRR; no murmur, no  rub or gallop  Lungs:  clear to auscultation bilaterally, no wheezing, rhonchi or rales  Abd: soft, nontender, no hepatomegaly  Ext: No edema Musculoskeletal:  No deformities, BUE and BLE strength normal and equal Skin: warm and dry  Neuro:  CNs 2-12 intact, no focal abnormalities noted Psych:  Normal affect    EKG:  The ECG was personally reviewed: 3/11 ECG is sinus rhythm, heart rate 84, no acute ischemic changes Telemetry: Sinus rhythm  Relevant CV Studies:  ECHO: None in the system  CATH: 12/28/2016  LM-2 lesion, 50 %stenosed.  LM-1 lesion, 20 %stenosed.  Ost 1st Diag lesion, 100 %stenosed.  Prox LAD to Mid LAD lesion, 80 %stenosed.  Mid LAD to Dist LAD lesion, 80 %stenosed.  Dist LAD lesion, 90 %stenosed.  3rd Mrg lesion, 40 %stenosed.  Ost 3rd Mrg lesion, 30 %stenosed.  Prox RCA lesion, 40 %stenosed.  Mid RCA lesion, 50 %stenosed.  Dist RCA lesion, 40 %stenosed.  RPDA lesion, 50 %stenosed.   Moderate global LV dysfunction with an ejection fraction of 40-45% with moderate hypocontractility involving the mid distal anterolateral wall and focal aneurysmal apical segment.  Severe multivessel native CAD with 20% smooth distal left main tapering, 20 and 50% very proximal LAD stenoses, with diffuse 80% in-stent restenosis in the mid LAD.  Comment diffuse irregularity and narrowing of the mid distal LAD of 80% which reaches 90% apically and a small caliber; 30 and 50% stenoses in the circumflex marginal vessel; and diffusely irregular dominant RCA with stenoses of 40 1540% in the proximal, mid and mid-distal segment with 50% PDA stenosis.  There is no competitive filling from any graft to any  vessel.  Two vein markers are visualized in the aorta.  It is uncertain as to where these grafts had gone and whether or not they were sequential grafts.  Both grafts are occluded at its origin.  The left internal mammary artery was injected but apparently this did not anastomose into any coronary vessel.  RECOMMENDATION: Medical therapy.  I will start patient on aspirin and Plavix.  Patient will be started on aggressive lipid-lowering therapy.  Nitrates will be added to medical regimen with beta blocker therapy with consideration of possible amlodipine.  We will try to obtain the surgical vascular records which are unavailable in Epic. Diagnostic Dominance: Right    Laboratory Data:  Chemistry Recent Labs  Lab 07/30/19 1530  NA 140  K 3.6  CL 103  CO2 24  GLUCOSE 117*  BUN 7*  CREATININE 0.77  CALCIUM 9.1  GFRNONAA >60  GFRAA >60  ANIONGAP 13    Recent Labs  Lab 07/30/19 1743  PROT 7.0  ALBUMIN 4.0  AST 25  ALT 18  ALKPHOS 83  BILITOT 0.5   Hematology Recent Labs  Lab 07/30/19 1530  WBC 7.2  RBC 4.42  HGB 14.1  HCT 42.4  MCV 95.9  MCH 31.9  MCHC 33.3  RDW 12.4  PLT 216   Cardiac Enzymes  High Sensitivity Troponin:   Recent Labs  Lab 07/30/19 1530 07/30/19 1743  TROPONINIHS 128* 1,207*     BNPNo results for input(s): BNP, PROBNP in the last 168 hours.  DDimer No results for input(s): DDIMER in the last 168 hours. Lipids:  Lab Results  Component Value Date   CHOL 216 (H) 12/28/2016   HDL 29 (L) 12/28/2016   LDLCALC 128 (H) 12/28/2016   TRIG 297 (H) 12/28/2016   CHOLHDL 7.4 12/28/2016   INR:  Lab Results  Component Value Date   INR 1.21 12/28/2016   A1c:  Lab Results  Component Value Date   HGBA1C 5.1 12/28/2016   Thyroid: No results found for: TSH, T3TOTAL, T4TOTAL, THYROIDAB  Radiology/Studies:  DG Chest 2 View  Result Date: 07/30/2019 CLINICAL DATA:  Chest pain starting 1 hour ago EXAM: CHEST - 2 VIEW COMPARISON:  January 04, 2014 FINDINGS: The heart size and mediastinal contours are stable. Both lungs are clear. The visualized skeletal structures are unremarkable. IMPRESSION: No active cardiopulmonary disease. Electronically Signed   By: Sherian Rein M.D.   On: 07/30/2019 15:38    Assessment and Plan:   1.  Non-STEMI: -In the setting of uncontrolled hypertension -Chest pain did not start with any exertion -She has substrate for angina based on her catheter in 2018 with significant LAD disease and an occluded diagonal, moderate disease in other vessels -Add heparin, nitrates, aspirin, beta-blocker -Continue to check enzymes and decide in a.m. if cath indicated -Check echo  2.  Hypertension: -She was previously on Toprol-XL 50 mg daily, restart this -We will also add amlodipine 2.5 mg -Follow blood pressure and heart rate  3.  Hyperlipidemia, goal LDL less than 70 -Check a lipid profile and LFTs -Start Lipitor 80  4.  Financial issues -She is now on Medicare, that will help -She may need referral to the patient assistance fund  Otherwise, continue home meds. Principal Problem:   NSTEMI (non-ST elevated myocardial infarction) (HCC) Active Problems:   Hypertension   Hyperlipidemia LDL goal <70     For questions or updates, please contact [facility] Please consult [email or web address] for contact info under Cardiology/STEMI.    Signed, Theodore Demark, PA-C  07/30/2019 7:52 PM   I have seen and examined the patient along with Theodore Demark, PA-C.  I have reviewed the chart, notes and new data.  I agree with PA/NP's note.  Key new complaints: She has had unpredictable angina off and on, resolving spontaneously over the last 24 hours.  She has not been taking any of her cardiac medications for about 6 months.  Currently asymptomatic. Key examination changes: Severely hypertensive.  No overt signs of hypervolemia/congestive heart failure.  No arrhythmia on monitor.  Obese. Key new findings /  data: Marked increase in cardiac troponin I consistent with small non-STEMI.   ECG shows very subtle ST segment changes in V2-V4.    PLAN: NSTEMI: This may represent demand ischemia in the LAD artery distribution in the setting of severe uncontrolled hypertension versus acute coronary syndrome due to new disease in a different territories such as the left circumflex artery.   The straightforward approach would be to perform repeat coronary angiography, but we may simply find that she has the same LAD disease that is not amenable to revascularization. Her pattern of noncompliance with medications goes back further than just the last 6 months, and repeat percutaneous revascularization may put her in a more dangerous situation if she stops taking her antiplatelet agents abruptly.   Would recommend restarting aspirin, clopidogrel, beta-blocker and controlling blood pressure with additional medication such as calcium channel blockers and/or nitrates as needed.  If unstable angina symptoms persist, she should undergo cardiac catheterization.  If she responds well to medical therapy, would consider simply continuing this medical therapy approach. May benefit from social worker evaluation to see if she qualifies for Medicaid. Part of her difficulty with making her appointments to get refills on her meds has been that she does not like to travel  to [facility] for follow-up appointments, due to the traffic.  She prefers follow-up in [facility], but lost contact with the cardiologists they are when they temporarily relocated to [facility].  She had good experiences in the past with Dr. Dulce Sellar and with Dr. Bing Matter.   Thurmon Fair, MD, [facility] [phone] 07/30/2019, 8:04 PM

## 2019-07-30 NOTE — ED Provider Notes (Signed)
[facility] EMERGENCY DEPARTMENT Provider Note   CSN: [id number] Arrival date & time: 07/30/19  1511     History Chief Complaint  Patient presents with  . Chest Pain    AVERYANNA Saunders is a 64 y.o. female with a past medical history of CAD status post CABG and multiple stents, hyperlipidemia, hypertension, ischemic cardiomyopathy, obesity, fibromyalgia, who presents today for evaluation of substernal chest pain. She reports that at about 315 she had sudden onset of pain.  Her pain is in the epigastric area and left chest.  She was at rest when this happened.  She states that she feels like she needs to burp.  She was given nitro x2 by EMS with resolution of her pain.  She reports that she has not been taking any of her medications since September of last year.  She denies any diaphoresis or significant shortness of breath. No nausea, vomiting, or diarrhea. She denies any fevers or known sick contacts. She states that the pain is in and under her left breast and occasionally radiates into her back.  She denies any trauma. HPI     Past Medical History:  Diagnosis Date  . CAD (coronary artery disease)    a. s/p CABG in 1998;  b. s/p prior LAD stenting;  c. 12/2016 Cath: LM 20/50, LAD 50p, diffuse 19m ISR, 80d, 90apical, D1 100, LCX min irregs, OM3 30/40, RCA 40p, 92m, 40d, RPDA 50. EF 40-45%, mid antlat HK, focal apical aneurysmal segment.  . Fibromyalgia   . Hyperlipidemia   . Hypertension   . Ischemic cardiomyopathy    a. 12/2016 LV Gram: EF 40-45% w/ moderate mid anterolateral HK and a focal apical aneurysmal segment.  . Obesity   . RBBB     Patient Active Problem List   Diagnosis Date Noted  . Ischemic cardiomyopathy   . Obesity   . STEMI (ST elevation myocardial infarction) (HCC) 12/28/2016  . ACS (acute coronary syndrome) (HCC) 12/28/2016  . Hypertension   . CAD (coronary artery disease)   . RBBB   . Fibromyalgia     Past Surgical History:    Procedure Laterality Date  . CORONARY ARTERY BYPASS GRAFT    . LEFT HEART CATH AND CORONARY ANGIOGRAPHY N/A 12/28/2016   Procedure: LEFT HEART CATH AND CORONARY ANGIOGRAPHY;  Surgeon: Lennette Bihari, MD;  Location: [facility];  Service: Cardiovascular;  Laterality: N/A;     OB History   No obstetric history on file.     No family history on file.  Social History   Tobacco Use  . Smoking status: Former Smoker    Quit date: 12/28/1996    Years since quitting: 22.6  . Smokeless tobacco: Former Engineer, water Use Topics  . Alcohol use: No  . Drug use: No    Home Medications Prior to Admission medications   Medication Sig Start Date End Date Taking? Authorizing Provider  acetaminophen (TYLENOL) 500 MG tablet Take 500-1,000 mg by mouth every 6 (six) hours as needed for headache (pain).    [provider]  aspirin 81 MG chewable tablet Chew 1 tablet (81 mg total) by mouth daily. 12/30/16   Creig Hines, NP  atorvastatin (LIPITOR) 80 MG tablet Take 1 tablet (80 mg total) by mouth daily at 6 PM. 12/30/16   Creig Hines, NP  clopidogrel (PLAVIX) 75 MG tablet Take 1 tablet by mouth once daily with breakfast 01/28/19   Lennette Bihari, MD  isosorbide mononitrate (IMDUR) 60 MG 24 hr tablet Take 1 tablet (60 mg total) by mouth daily. 01/28/19   Lennette Bihari, MD  meclizine (ANTIVERT) 25 MG tablet Take 25 mg by mouth 3 (three) times daily as needed for dizziness.    [provider]  metoprolol succinate (TOPROL-XL) 50 MG 24 hr tablet Take 1 tablet (50 mg total) by mouth daily. 01/28/19   Lennette Bihari, MD  nitroGLYCERIN (NITROSTAT) 0.4 MG SL tablet Place 1 tablet (0.4 mg total) under the tongue every 5 (five) minutes as needed for chest pain. 12/30/16   Creig Hines, NP  pantoprazole (PROTONIX) 40 MG tablet Take 1 tablet (40 mg total) by mouth daily. 02/05/17   Azalee Course, PA    Allergies    Phenergan [promethazine] and Sulfa  antibiotics  Review of Systems   Review of Systems  Constitutional: Negative for chills and fever.  Respiratory: Negative for shortness of breath.   Cardiovascular: Positive for chest pain. Negative for leg swelling.  Gastrointestinal: Negative for abdominal pain, nausea and vomiting.  Genitourinary: Negative for dysuria.  Musculoskeletal: Positive for back pain. Negative for neck pain.  Skin: Negative for color change, rash and wound.  Neurological: Negative for weakness.  All other systems reviewed and are negative.   Physical Exam Updated Vital Signs BP (!) 149/99   Pulse 77   Temp 98.2 F (36.8 C) (Oral)   Resp 13   Ht 5\' 4"  (1.626 m)   SpO2 97%   BMI 33.30 kg/m   Physical Exam Vitals and nursing note reviewed. Exam conducted with a chaperone present (ed tech in room).  Constitutional:      General: She is not in acute distress.    Appearance: She is well-developed. She is not diaphoretic.  HENT:     Head: Normocephalic and atraumatic.  Eyes:     General: No scleral icterus.       Right eye: No discharge.        Left eye: No discharge.     Conjunctiva/sclera: Conjunctivae normal.  Cardiovascular:     Rate and Rhythm: Normal rate and regular rhythm.     Pulses:          Radial pulses are 2+ on the right side and 2+ on the left side.       Posterior tibial pulses are 2+ on the right side and 2+ on the left side.     Heart sounds: Normal heart sounds. No murmur.  Pulmonary:     Effort: Pulmonary effort is normal. No respiratory distress.     Breath sounds: Normal breath sounds. No stridor. No decreased breath sounds.  Chest:     Chest wall: No tenderness.     Comments: Limited exam of the left breast with occasional tenderness to palpation above the nipple on the medial aspect, however patient additionally has tenderness under the breast.   Abdominal:     General: There is no distension.     Tenderness: There is no abdominal tenderness.  Musculoskeletal:         General: No deformity.     Cervical back: Normal range of motion.     Right lower leg: No tenderness. No edema.     Left lower leg: No tenderness. No edema.  Skin:    General: Skin is warm and dry.  Neurological:     General: No focal deficit present.     Mental Status: She is alert.  Motor: No abnormal muscle tone.  Psychiatric:        Mood and Affect: Mood normal.        Behavior: Behavior normal.     ED Results / Procedures / Treatments   Labs (all labs ordered are listed, but only abnormal results are displayed) Labs Reviewed  BASIC METABOLIC PANEL - Abnormal; Notable for the following components:      Result Value   Glucose, Bld 117 (*)    BUN 7 (*)    All other components within normal limits  TROPONIN I (HIGH SENSITIVITY) - Abnormal; Notable for the following components:   Troponin I (High Sensitivity) 128 (*)    All other components within normal limits  TROPONIN I (HIGH SENSITIVITY) - Abnormal; Notable for the following components:   Troponin I (High Sensitivity) 1,207 (*)    All other components within normal limits  RESPIRATORY PANEL BY RT PCR (FLU A&B, COVID)  CBC  LIPASE, BLOOD  HEPATIC FUNCTION PANEL  POC SARS CORONAVIRUS 2 AG -  ED    EKG EKG Interpretation  Date/Time:  Thursday July 30 2019 18:07:35 EST Ventricular Rate:  84 PR Interval:  172 QRS Duration: 102 QT Interval:  390 QTC Calculation: 461 R Axis:   62 Text Interpretation: Sinus rhythm Borderline repolarization abnormality Confirmed by Kristine Royal [phone]) on 07/30/2019 6:09:52 PM   Radiology DG Chest 2 View  Result Date: 07/30/2019 CLINICAL DATA:  Chest pain starting 1 hour ago EXAM: CHEST - 2 VIEW COMPARISON:  January 04, 2014 FINDINGS: The heart size and mediastinal contours are stable. Both lungs are clear. The visualized skeletal structures are unremarkable. IMPRESSION: No active cardiopulmonary disease. Electronically Signed   By: Sherian Rein M.D.   On: 07/30/2019 15:38     Procedures .Critical Care Performed by: Cristina Gong, PA-C Authorized by: Cristina Gong, PA-C   Critical care provider statement:    Critical care time (minutes):  45   Critical care was necessary to treat or prevent imminent or life-threatening deterioration of the following conditions:  Cardiac failure and circulatory failure   Critical care was time spent personally by me on the following activities:  Discussions with consultants, evaluation of patient's response to treatment, examination of patient, ordering and performing treatments and interventions, ordering and review of laboratory studies, ordering and review of radiographic studies, pulse oximetry, re-evaluation of patient's condition, obtaining history from patient or surrogate and review of old charts   (including critical care time)  Medications Ordered in ED Medications  nitroGLYCERIN (NITROSTAT) SL tablet 0.4 mg (has no administration in time range)  clopidogrel (PLAVIX) tablet 300 mg (has no administration in time range)  metoprolol succinate (TOPROL-XL) 24 hr tablet 50 mg (has no administration in time range)  sodium chloride flush (NS) 0.9 % injection 3 mL (3 mLs Intravenous Given 07/30/19 1852)    ED Course  I have reviewed the triage vital signs and the nursing notes.  Pertinent labs & imaging results that were available during my care of the patient were reviewed by me and considered in my medical decision making (see chart for details).  Clinical Course as of Jul 30 1911  Thu Jul 30, 2019  1908 Spoke with cardiology who will see patient for admission.   [EH]    Clinical Course User Index [EH] Norman Clay   MDM Rules/Calculators/A&P                     Patient  is a 64 year old woman who presents today for multiple intermittent episodes of chest pain starting at 3:15 PM today.  Chest pain is substernal to left-sided. On initial exam she overall appears well is awake and alert.   She is not diaphoretic or vomiting. Exam does show mild epigastric tenderness along with some tenderness to palpation in the left breast.  She has a significant cardiac history including at least 3 previous MIs, previous CABG and previous stent placement.  Initial troponin is elevated at 128, repeat troponin is 1207. I spoke with on-call cardiology who recommended giving patient a Plavix load of 300 mg, giving her a dose of her p.o. metoprolol 50 mg and heparinizing her.  These orders were repeated to cardiologist to ensure accuracy.  Covid testing was sent.  Cardiology states they will admit patient.    This patient was seen as a shared visit with Dr. Francia Greaves.   Note: Portions of this report may have been transcribed using voice recognition software. Every effort was made to ensure accuracy; however, inadvertent computerized transcription errors may be present   Final Clinical Impression(s) / ED Diagnoses Final diagnoses:  NSTEMI (non-ST elevated myocardial infarction) [facility])  Elevated troponin    Rx / DC Orders ED Discharge Orders    None       Ollen Gross 07/30/19 2032    Valarie Merino, MD 07/30/19 2145

## 2019-07-30 NOTE — ED Triage Notes (Signed)
Came in from home via EMS; c/o mid sternal chest pain radiates to back x 45 mins ago. ASA 324 mg + NTG SL 0.4 x 2 given PTA; hx of MI x 3; Patient reported relief from NTG. Pt stated now feels "congested"

## 2019-07-30 NOTE — Progress Notes (Signed)
ANTICOAGULATION CONSULT NOTE - Initial Consult  Pharmacy Consult for heparin Indication: chest pain/ACS  Allergies  Allergen Reactions  . Phenergan [Promethazine]   . Sulfa Antibiotics Rash    Patient Measurements: Height: 5\' 4"  (162.6 cm) Weight: 199 lb 1.2 oz (90.3 kg) IBW/kg (Calculated) : 54.7 Heparin Dosing Weight: 75  Vital Signs: Temp: 98.2 F (36.8 C) (03/11 1525) Temp Source: Oral (03/11 1525) BP: 149/99 (03/11 1845) Pulse Rate: 77 (03/11 1845)  Labs: Recent Labs    07/30/19 1530 07/30/19 1743  HGB 14.1  --   HCT 42.4  --   PLT 216  --   CREATININE 0.77  --   TROPONINIHS 128* 1,207*    Estimated Creatinine Clearance: 76.3 mL/min (by C-G formula based on SCr of 0.77 mg/dL).   Medical History: Past Medical History:  Diagnosis Date  . CAD (coronary artery disease)    a. s/p CABG in 1998;  b. s/p prior LAD stenting;  c. 12/2016 Cath: LM 20/50, LAD 50p, diffuse 80m ISR, 80d, 90apical, D1 100, LCX min irregs, OM3 30/40, RCA 40p, 84m, 40d, RPDA 50. EF 40-45%, mid antlat HK, focal apical aneurysmal segment.  . Fibromyalgia   . Hyperlipidemia   . Hypertension   . Ischemic cardiomyopathy    a. 12/2016 LV Gram: EF 40-45% w/ moderate mid anterolateral HK and a focal apical aneurysmal segment.  . Obesity   . RBBB     Medications:  Scheduled:  . clopidogrel  300 mg Oral Once  . heparin  4,000 Units Intravenous Once  . metoprolol succinate  50 mg Oral Daily    Assessment: Sharon Saunders is aq 64 y/o presenting with chest pain. Pharmacy has been consulted to dose heparin. She is not on any anticoagulation PTA. Of note, she is on aspirin and plavix PTA.  Troponin 76, CBC WNL, no active bleeding  Goal of Therapy:  Heparin level 0.3-0.7 units/ml Monitor platelets by anticoagulation protocol: Yes   Plan:  Give 4000 units bolus x 1 Start heparin infusion at 900 units/hr Check anti-Xa level in 6 hours and daily while on heparin Continue to monitor H&H and  platelets  [id number], PharmD PGY1 Ambulatory Care Pharmacy Resident 07/30/2019,7:36 PM

## 2019-07-31 ENCOUNTER — Encounter (HOSPITAL_COMMUNITY): Payer: Self-pay | Admitting: Cardiovascular Disease

## 2019-07-31 ENCOUNTER — Encounter (HOSPITAL_COMMUNITY)
Admission: EM | Disposition: A | Discharge status: Home / Self Care | Payer: Self-pay | Source: Home / Self Care | Attending: Cardiovascular Disease

## 2019-07-31 ENCOUNTER — Inpatient Hospital Stay (HOSPITAL_COMMUNITY): Payer: Medicare HMO

## 2019-07-31 ENCOUNTER — Telehealth: Payer: Self-pay | Admitting: Cardiology

## 2019-07-31 DIAGNOSIS — I255 Ischemic cardiomyopathy: Secondary | ICD-10-CM | POA: Diagnosis not present

## 2019-07-31 DIAGNOSIS — I214 Non-ST elevation (NSTEMI) myocardial infarction: Secondary | ICD-10-CM

## 2019-07-31 DIAGNOSIS — Z7982 Long term (current) use of aspirin: Secondary | ICD-10-CM | POA: Diagnosis not present

## 2019-07-31 DIAGNOSIS — Z888 Allergy status to other drugs, medicaments and biological substances status: Secondary | ICD-10-CM | POA: Diagnosis not present

## 2019-07-31 DIAGNOSIS — Z882 Allergy status to sulfonamides status: Secondary | ICD-10-CM | POA: Diagnosis not present

## 2019-07-31 DIAGNOSIS — E669 Obesity, unspecified: Secondary | ICD-10-CM | POA: Diagnosis not present

## 2019-07-31 DIAGNOSIS — I10 Essential (primary) hypertension: Secondary | ICD-10-CM | POA: Diagnosis not present

## 2019-07-31 DIAGNOSIS — Z599 Problem related to housing and economic circumstances, unspecified: Secondary | ICD-10-CM | POA: Diagnosis not present

## 2019-07-31 DIAGNOSIS — Z79899 Other long term (current) drug therapy: Secondary | ICD-10-CM | POA: Diagnosis not present

## 2019-07-31 DIAGNOSIS — T50916A Underdosing of multiple unspecified drugs, medicaments and biological substances, initial encounter: Secondary | ICD-10-CM | POA: Diagnosis present

## 2019-07-31 DIAGNOSIS — Z7902 Long term (current) use of antithrombotics/antiplatelets: Secondary | ICD-10-CM | POA: Diagnosis not present

## 2019-07-31 DIAGNOSIS — R072 Precordial pain: Secondary | ICD-10-CM | POA: Diagnosis present

## 2019-07-31 DIAGNOSIS — E785 Hyperlipidemia, unspecified: Secondary | ICD-10-CM | POA: Diagnosis not present

## 2019-07-31 DIAGNOSIS — I4510 Unspecified right bundle-branch block: Secondary | ICD-10-CM | POA: Diagnosis not present

## 2019-07-31 DIAGNOSIS — Z91120 Patient's intentional underdosing of medication regimen due to financial hardship: Secondary | ICD-10-CM | POA: Diagnosis not present

## 2019-07-31 DIAGNOSIS — Z20822 Contact with and (suspected) exposure to covid-19: Secondary | ICD-10-CM | POA: Diagnosis not present

## 2019-07-31 DIAGNOSIS — M797 Fibromyalgia: Secondary | ICD-10-CM | POA: Diagnosis not present

## 2019-07-31 DIAGNOSIS — I2510 Atherosclerotic heart disease of native coronary artery without angina pectoris: Secondary | ICD-10-CM | POA: Diagnosis not present

## 2019-07-31 DIAGNOSIS — Z6833 Body mass index (BMI) 33.0-33.9, adult: Secondary | ICD-10-CM | POA: Diagnosis not present

## 2019-07-31 DIAGNOSIS — Z951 Presence of aortocoronary bypass graft: Secondary | ICD-10-CM | POA: Diagnosis not present

## 2019-07-31 DIAGNOSIS — I25119 Atherosclerotic heart disease of native coronary artery with unspecified angina pectoris: Secondary | ICD-10-CM | POA: Diagnosis not present

## 2019-07-31 DIAGNOSIS — Z955 Presence of coronary angioplasty implant and graft: Secondary | ICD-10-CM | POA: Diagnosis not present

## 2019-07-31 HISTORY — PX: LEFT HEART CATH AND CORS/GRAFTS ANGIOGRAPHY: CATH118250

## 2019-07-31 HISTORY — PX: CORONARY STENT INTERVENTION: CATH118234

## 2019-07-31 LAB — COMPREHENSIVE METABOLIC PANEL
ALT: 20 U/L (ref 0–44)
AST: 52 U/L — ABNORMAL HIGH (ref 15–41)
Albumin: 3.7 g/dL (ref 3.5–5.0)
Alkaline Phosphatase: 73 U/L (ref 38–126)
Anion gap: 12 (ref 5–15)
BUN: 5 mg/dL — ABNORMAL LOW (ref 8–23)
CO2: 25 mmol/L (ref 22–32)
Calcium: 8.9 mg/dL (ref 8.9–10.3)
Chloride: 101 mmol/L (ref 98–111)
Creatinine, Ser: 0.61 mg/dL (ref 0.44–1.00)
GFR calc Af Amer: >60 mL/min (ref >60)
GFR calc non Af Amer: >60 mL/min (ref >60)
Glucose, Bld: 109 mg/dL — ABNORMAL HIGH (ref 70–99)
Potassium: 3.3 mmol/L — ABNORMAL LOW (ref 3.5–5.1)
Sodium: 138 mmol/L (ref 135–145)
Total Bilirubin: 1.4 mg/dL — ABNORMAL HIGH (ref 0.3–1.2)
Total Protein: 6.3 g/dL — ABNORMAL LOW (ref 6.5–8.1)

## 2019-07-31 LAB — CBC
HCT: 40.7 % (ref 36.0–46.0)
Hemoglobin: 13.5 g/dL (ref 12.0–15.0)
MCH: 31.1 pg (ref 26.0–34.0)
MCHC: 33.2 g/dL (ref 30.0–36.0)
MCV: 93.8 fL (ref 80.0–100.0)
Platelets: 230 10*3/uL (ref 150–400)
RBC: 4.34 MIL/uL (ref 3.87–5.11)
RDW: 12.2 % (ref 11.5–15.5)
WBC: 9.9 10*3/uL (ref 4.0–10.5)
nRBC: 0 % (ref 0.0–0.2)

## 2019-07-31 LAB — LIPID PANEL
Cholesterol: 230 mg/dL — ABNORMAL HIGH (ref 0–200)
HDL: 38 mg/dL — ABNORMAL LOW (ref >40)
LDL Cholesterol: 162 mg/dL — ABNORMAL HIGH (ref 0–99)
Total CHOL/HDL Ratio: 6.1 RATIO
Triglycerides: 151 mg/dL — ABNORMAL HIGH (ref <150)
VLDL: 30 mg/dL (ref 0–40)

## 2019-07-31 LAB — HEPARIN LEVEL (UNFRACTIONATED)
Heparin Unfractionated: 0.3 IU/mL (ref 0.30–0.70)
Heparin Unfractionated: 0.24 IU/mL — ABNORMAL LOW (ref 0.30–0.70)

## 2019-07-31 LAB — HEMOGLOBIN A1C
Hgb A1c MFr Bld: 5 % (ref 4.8–5.6)
Mean Plasma Glucose: 96.8 mg/dL

## 2019-07-31 LAB — ECHOCARDIOGRAM COMPLETE
Height: 64 in
Weight: 3082.91 oz

## 2019-07-31 LAB — TROPONIN I (HIGH SENSITIVITY): Troponin I (High Sensitivity): 5362 ng/L (ref <18)

## 2019-07-31 LAB — POCT ACTIVATED CLOTTING TIME
Activated Clotting Time: 269 seconds
Activated Clotting Time: 747 seconds

## 2019-07-31 LAB — HIV ANTIBODY (ROUTINE TESTING W REFLEX): HIV Screen 4th Generation wRfx: NONREACTIVE

## 2019-07-31 SURGERY — LEFT HEART CATH AND CORS/GRAFTS ANGIOGRAPHY
Anesthesia: LOCAL

## 2019-07-31 MED ORDER — SODIUM CHLORIDE 0.9% FLUSH: 3.0000 mL | Freq: Two times a day (BID) | INTRAVENOUS | Status: DC

## 2019-07-31 MED ORDER — SODIUM CHLORIDE 0.9% FLUSH
3.0000 mL | Freq: Two times a day (BID) | INTRAVENOUS | Status: DC
  Administered 2019-08-01: 3 mL via INTRAVENOUS

## 2019-07-31 MED ORDER — HEPARIN SODIUM (PORCINE) 1000 UNIT/ML IJ SOLN
INTRAMUSCULAR | Status: AC
  Filled 2019-07-31: qty 1

## 2019-07-31 MED ORDER — ISOSORBIDE MONONITRATE ER 60 MG PO TB24
60.0000 mg | ORAL_TABLET | Freq: Every day | ORAL | Status: DC
  Administered 2019-07-31 – 2019-08-01 (×2): 60 mg via ORAL
  Filled 2019-07-31 (×2): qty 1

## 2019-07-31 MED ORDER — LIDOCAINE HCL (PF) 1 % IJ SOLN
INTRAMUSCULAR | Status: AC
  Filled 2019-07-31: qty 30

## 2019-07-31 MED ORDER — LIDOCAINE HCL (PF) 1 % IJ SOLN
INTRAMUSCULAR | Status: DC | PRN
  Administered 2019-07-31: 2 mL via INTRADERMAL

## 2019-07-31 MED ORDER — SODIUM CHLORIDE 0.9 % WEIGHT BASED INFUSION
1.0000 mL/kg/h | INTRAVENOUS | Status: DC
  Administered 2019-07-31: 1 mL/kg/h via INTRAVENOUS

## 2019-07-31 MED ORDER — SODIUM CHLORIDE 0.9% FLUSH: 3.0000 mL | INTRAVENOUS | Status: DC | PRN

## 2019-07-31 MED ORDER — ALUM & MAG HYDROXIDE-SIMETH 200-200-20 MG/5ML PO SUSP
ORAL | Status: DC | PRN
  Administered 2019-07-31: 30 mL via ORAL

## 2019-07-31 MED ORDER — FAMOTIDINE IN NACL 20-0.9 MG/50ML-% IV SOLN
INTRAVENOUS | Status: AC
  Filled 2019-07-31: qty 50

## 2019-07-31 MED ORDER — SODIUM CHLORIDE 0.9 % WEIGHT BASED INFUSION: 1.0000 mL/kg/h | INTRAVENOUS | Status: DC

## 2019-07-31 MED ORDER — FENTANYL CITRATE (PF) 100 MCG/2ML IJ SOLN
INTRAMUSCULAR | Status: DC | PRN
  Administered 2019-07-31: 25 ug via INTRAVENOUS
  Administered 2019-07-31: 50 ug via INTRAVENOUS
  Administered 2019-07-31: 25 ug via INTRAVENOUS

## 2019-07-31 MED ORDER — SODIUM CHLORIDE 0.9 % IV SOLN: 250.0000 mL | INTRAVENOUS | Status: DC | PRN

## 2019-07-31 MED ORDER — FENTANYL CITRATE (PF) 100 MCG/2ML IJ SOLN
INTRAMUSCULAR | Status: AC
  Filled 2019-07-31: qty 2

## 2019-07-31 MED ORDER — ALUM & MAG HYDROXIDE-SIMETH 200-200-20 MG/5ML PO SUSP
ORAL | Status: AC
  Filled 2019-07-31: qty 30

## 2019-07-31 MED ORDER — IOHEXOL 350 MG/ML SOLN
INTRAVENOUS | Status: AC
  Filled 2019-07-31: qty 1

## 2019-07-31 MED ORDER — POTASSIUM CHLORIDE CRYS ER 20 MEQ PO TBCR
30.0000 meq | EXTENDED_RELEASE_TABLET | ORAL | Status: AC
  Administered 2019-07-31: 30 meq via ORAL
  Filled 2019-07-31: qty 1

## 2019-07-31 MED ORDER — SODIUM CHLORIDE 0.9 % WEIGHT BASED INFUSION
1.0000 mL/kg/h | INTRAVENOUS | Status: AC
  Administered 2019-07-31: 1 mL/kg/h via INTRAVENOUS

## 2019-07-31 MED ORDER — MIDAZOLAM HCL 2 MG/2ML IJ SOLN
INTRAMUSCULAR | Status: AC
  Filled 2019-07-31: qty 2

## 2019-07-31 MED ORDER — CLOPIDOGREL BISULFATE 300 MG PO TABS
ORAL_TABLET | ORAL | Status: DC | PRN
  Administered 2019-07-31: 600 mg via ORAL

## 2019-07-31 MED ORDER — VERAPAMIL HCL 2.5 MG/ML IV SOLN
INTRAVENOUS | Status: AC
  Filled 2019-07-31: qty 2

## 2019-07-31 MED ORDER — HEPARIN (PORCINE) IN NACL 1000-0.9 UT/500ML-% IV SOLN
INTRAVENOUS | Status: AC
  Filled 2019-07-31: qty 1000

## 2019-07-31 MED ORDER — VERAPAMIL HCL 2.5 MG/ML IV SOLN
INTRAVENOUS | Status: DC | PRN
  Administered 2019-07-31: 10 mL via INTRA_ARTERIAL

## 2019-07-31 MED ORDER — SODIUM CHLORIDE 0.9 % WEIGHT BASED INFUSION: 3.0000 mL/kg/h | INTRAVENOUS | Status: DC

## 2019-07-31 MED ORDER — NITROGLYCERIN 1 MG/10 ML FOR IR/CATH LAB
INTRA_ARTERIAL | Status: DC | PRN
  Administered 2019-07-31 (×2): 200 ug via INTRACORONARY

## 2019-07-31 MED ORDER — NITROGLYCERIN 1 MG/10 ML FOR IR/CATH LAB
INTRA_ARTERIAL | Status: AC
  Filled 2019-07-31: qty 10

## 2019-07-31 MED ORDER — HEPARIN SODIUM (PORCINE) 1000 UNIT/ML IJ SOLN
INTRAMUSCULAR | Status: DC | PRN
  Administered 2019-07-31: 4000 [IU] via INTRAVENOUS
  Administered 2019-07-31: 3000 [IU] via INTRAVENOUS
  Administered 2019-07-31: 4500 [IU] via INTRAVENOUS

## 2019-07-31 MED ORDER — HEPARIN SODIUM (PORCINE) 5000 UNIT/ML IJ SOLN
5000.0000 [IU] | Freq: Three times a day (TID) | INTRAMUSCULAR | Status: DC
  Administered 2019-08-01 (×2): 5000 [IU] via SUBCUTANEOUS
  Filled 2019-07-31 (×2): qty 1

## 2019-07-31 MED ORDER — CLOPIDOGREL BISULFATE 75 MG PO TABS
ORAL_TABLET | ORAL | Status: AC
  Filled 2019-07-31: qty 2

## 2019-07-31 MED ORDER — FAMOTIDINE IN NACL 20-0.9 MG/50ML-% IV SOLN
INTRAVENOUS | Status: AC | PRN
  Administered 2019-07-31: 20 mg via INTRAVENOUS

## 2019-07-31 MED ORDER — IOHEXOL 350 MG/ML SOLN
INTRAVENOUS | Status: DC | PRN
  Administered 2019-07-31: 95 mL via INTRA_ARTERIAL

## 2019-07-31 MED ORDER — MIDAZOLAM HCL 2 MG/2ML IJ SOLN
INTRAMUSCULAR | Status: DC | PRN
  Administered 2019-07-31 (×2): 1 mg via INTRAVENOUS

## 2019-07-31 MED ORDER — HEPARIN (PORCINE) IN NACL 1000-0.9 UT/500ML-% IV SOLN
INTRAVENOUS | Status: DC | PRN
  Administered 2019-07-31 (×2): 500 mL

## 2019-07-31 SURGICAL SUPPLY — 22 items
BALLN SAPPHIRE 2.5X12 (BALLOONS) ×2
BALLOON SAPPHIRE 2.5X12 (BALLOONS) IMPLANT
CATH INFINITI 5FR AL1 (CATHETERS) ×1 IMPLANT
CATH INFINITI 5FR MULTPACK ANG (CATHETERS) ×1 IMPLANT
CATH LAUNCHER 6FR EBU3.5 (CATHETERS) ×1 IMPLANT
DEVICE RAD COMP TR BAND LRG (VASCULAR PRODUCTS) ×1 IMPLANT
ELECT DEFIB PAD ADLT CADENCE (PAD) ×1 IMPLANT
GLIDESHEATH SLEND SS 6F .021 (SHEATH) ×1 IMPLANT
GUIDEWIRE INQWIRE 1.5J.035X260 (WIRE) IMPLANT
INQWIRE 1.5J .035X260CM (WIRE) ×2
KIT ENCORE 26 ADVANTAGE (KITS) ×1 IMPLANT
KIT HEART LEFT (KITS) ×2 IMPLANT
PACK CARDIAC CATHETERIZATION (CUSTOM PROCEDURE TRAY) ×2 IMPLANT
STENT SYNERGY XD 3.0X24 (Permanent Stent) IMPLANT
SYNERGY XD 3.0X24 (Permanent Stent) ×2 IMPLANT
TRANSDUCER W/STOPCOCK (MISCELLANEOUS) ×2 IMPLANT
TUBING ART PRESS 72  MALE/FEM (TUBING) ×2
TUBING ART PRESS 72 MALE/FEM (TUBING) IMPLANT
TUBING CIL FLEX 10 FLL-RA (TUBING) ×2 IMPLANT
WIRE ASAHI PROWATER 180CM (WIRE) ×1 IMPLANT
WIRE HI TORQ BMW 190CM (WIRE) ×1 IMPLANT
WIRE HI TORQ VERSACORE-J 145CM (WIRE) ×1 IMPLANT

## 2019-07-31 NOTE — Care Management (Signed)
1206 07-31-19 Case Manager received consult for medications. Patient now has [employer] Medicare-before she had this insurance she had difficulty affording medications. Patient was previously on Aspirin and Plavix. Case Manager will follow patient post cath to see if any new medications ordered. Prior to arrival patient was from home alone; however, grandson stays with her at times. Daughter lives next door. Case Manager will follow for additional transition of care needs as the patient progresses. Gala Lewandowsky, RN,BSN Case Manager [phone]

## 2019-07-31 NOTE — Progress Notes (Signed)
ANTICOAGULATION CONSULT NOTE   Pharmacy Consult for Heparin Indication: chest pain/ACS  Allergies  Allergen Reactions  . Phenergan [Promethazine] Anxiety, Rash and Other (See Comments)    Received a shot an an E.D.and immediately became very jittery  . Sulfa Antibiotics Rash    Patient Measurements: Height: 5\' 4"  (162.6 cm) Weight: 192 lb 10.9 oz (87.4 kg) IBW/kg (Calculated) : 54.7 Heparin Dosing Weight: 75  Vital Signs: Temp: 98.4 F (36.9 C) (03/11 2216) Temp Source: Oral (03/11 2216) BP: 136/85 (03/12 0159) Pulse Rate: 79 (03/11 2216)  Labs: Recent Labs    07/30/19 1530 07/30/19 1530 07/30/19 1743 07/30/19 2244 07/31/19 0012 07/31/19 0300  HGB 14.1  --   --   --  13.5  --   HCT 42.4  --   --   --  40.7  --   PLT 216  --   --   --  230  --   HEPARINUNFRC  --   --   --   --   --  0.30  CREATININE 0.77  --   --   --  0.61  --   TROPONINIHS 128*   < > 1,207* 4,216* 5,362*  --    < > = values in this interval not displayed.    Estimated Creatinine Clearance: 75 mL/min (by C-G formula based on SCr of 0.61 mg/dL).   Medical History: Past Medical History:  Diagnosis Date  . Arthritis    osteoarthritis knees  . CAD (coronary artery disease)    a. s/p CABG in 1998;  b. s/p prior LAD stenting;  c. 12/2016 Cath: LM 20/50, LAD 50p, diffuse 71m ISR, 80d, 90apical, D1 100, LCX min irregs, OM3 30/40, RCA 40p, 50m, 40d, RPDA 50. EF 40-45%, mid antlat HK, focal apical aneurysmal segment.  . Fibromyalgia   . Hyperlipidemia   . Hypertension   . Ischemic cardiomyopathy    a. 12/2016 LV Gram: EF 40-45% w/ moderate mid anterolateral HK and a focal apical aneurysmal segment.  . NSTEMI (non-ST elevated myocardial infarction) (HCC) 07/30/2019  . Obesity   . RBBB     Medications:  Scheduled:  . aspirin  81 mg Oral Daily  . atorvastatin  80 mg Oral q1800  . clopidogrel  75 mg Oral Daily  . metoprolol succinate  50 mg Oral Daily  . pantoprazole  40 mg Oral Daily     Assessment: Sharon Saunders is aq 64 y/o presenting with chest pain. Pharmacy has been consulted to dose heparin. She is not on any anticoagulation PTA. Of note, she is on aspirin and plavix PTA.  Troponin 76, CBC WNL, no active bleeding  3/12 AM update:  Heparin level therapeutic  Troponin rising  Goal of Therapy:  Heparin level 0.3-0.7 units/ml Monitor platelets by anticoagulation protocol: Yes   Plan:  Cont heparin at 900 units/hr 1200 heparin level  5/12, PharmD, BCPS Clinical Pharmacist Phone: [phone]

## 2019-07-31 NOTE — Plan of Care (Signed)

## 2019-07-31 NOTE — Progress Notes (Addendum)
Progress Note  Patient Name: Sharon Saunders Date of Encounter: 07/31/2019  Primary Cardiologist: Nicki Guadalajara, MD   Subjective   Still with some reflux like discomfort in her chest this morning. Pain has improved from admission. She is motivated to improve her health. She would like to follow with [facility] in [place].   Inpatient Medications    Scheduled Meds: . aspirin  81 mg Oral Daily  . atorvastatin  80 mg Oral q1800  . clopidogrel  75 mg Oral Daily  . metoprolol succinate  50 mg Oral Daily  . pantoprazole  40 mg Oral Daily   Continuous Infusions: . heparin 900 Units/hr (07/30/19 2008)  . nitroGLYCERIN 10 mcg/min (07/31/19 0343)   PRN Meds: acetaminophen, meclizine, nitroGLYCERIN, ondansetron (ZOFRAN) IV, zolpidem   Vital Signs    Vitals:   07/31/19 0159 07/31/19 0248 07/31/19 0338 07/31/19 0611  BP: 136/85  117/75 116/73  Pulse:   72   Resp:   20   Temp:   98.3 F (36.8 C)   TempSrc:   Oral   SpO2:   97%   Weight:  87.4 kg    Height:        Intake/Output Summary (Last 24 hours) at 07/31/2019 0842 Last data filed at 07/31/2019 0300 Gross per 24 hour  Intake 370.34 ml  Output --  Net 370.34 ml   Filed Weights   07/30/19 1900 07/30/19 1923 07/31/19 0248  Weight: 90.3 kg 90.3 kg 87.4 kg    Telemetry    Sinus rhythm with occasional PVC - Personally Reviewed  ECG    Sinus rhythm with rate 72 bpm, new TWI in anterolateral leads, no STE/D - Personally Reviewed  Physical Exam   GEN: Obese female sitting upright in bed in no acute distress.   Neck: No JVD, no carotid bruits Cardiac: RRR, no murmurs, rubs, or gallops.  Respiratory: Clear to auscultation bilaterally, no wheezes/ rales/ rhonchi GI: NABS, Soft, obese, nontender, non-distended  MS: No edema; No deformity. Neuro:  Nonfocal, moving all extremities spontaneously Psych: Normal affect   Labs    Chemistry Recent Labs  Lab 07/30/19 1530 07/30/19 1743 07/31/19 0012  NA 140  --   138  K 3.6  --  3.3*  CL 103  --  101  CO2 24  --  25  GLUCOSE 117*  --  109*  BUN 7*  --  5*  CREATININE 0.77  --  0.61  CALCIUM 9.1  --  8.9  PROT  --  7.0 6.3*  ALBUMIN  --  4.0 3.7  AST  --  25 52*  ALT  --  18 20  ALKPHOS  --  83 73  BILITOT  --  0.5 1.4*  GFRNONAA >60  --  >60  GFRAA >60  --  >60  ANIONGAP 13  --  12     Hematology Recent Labs  Lab 07/30/19 1530 07/31/19 0012  WBC 7.2 9.9  RBC 4.42 4.34  HGB 14.1 13.5  HCT 42.4 40.7  MCV 95.9 93.8  MCH 31.9 31.1  MCHC 33.3 33.2  RDW 12.4 12.2  PLT 216 230    Cardiac EnzymesNo results for input(s): TROPONINI in the last 168 hours. No results for input(s): TROPIPOC in the last 168 hours.   BNPNo results for input(s): BNP, PROBNP in the last 168 hours.   DDimer No results for input(s): DDIMER in the last 168 hours.   Radiology    DG Chest 2  View  Result Date: 07/30/2019 CLINICAL DATA:  Chest pain starting 1 hour ago EXAM: CHEST - 2 VIEW COMPARISON:  January 04, 2014 FINDINGS: The heart size and mediastinal contours are stable. Both lungs are clear. The visualized skeletal structures are unremarkable. IMPRESSION: No active cardiopulmonary disease. Electronically Signed   By: Sherian Rein M.D.   On: 07/30/2019 15:38    Cardiac Studies   Left heart catheterization 2018:  LM-2 lesion, 50 %stenosed.  LM-1 lesion, 20 %stenosed.  Ost 1st Diag lesion, 100 %stenosed.  Prox LAD to Mid LAD lesion, 80 %stenosed.  Mid LAD to Dist LAD lesion, 80 %stenosed.  Dist LAD lesion, 90 %stenosed.  3rd Mrg lesion, 40 %stenosed.  Ost 3rd Mrg lesion, 30 %stenosed.  Prox RCA lesion, 40 %stenosed.  Mid RCA lesion, 50 %stenosed.  Dist RCA lesion, 40 %stenosed.  RPDA lesion, 50 %stenosed.   Moderate global LV dysfunction with an ejection fraction of 40-45% with moderate hypocontractility involving the mid distal anterolateral wall and focal aneurysmal apical segment.  Severe multivessel native CAD with 20% smooth  distal left main tapering, 20 and 50% very proximal LAD stenoses, with diffuse 80% in-stent restenosis in the mid LAD.  Comment diffuse irregularity and narrowing of the mid distal LAD of 80% which reaches 90% apically and a small caliber; 30 and 50% stenoses in the circumflex marginal vessel; and diffusely irregular dominant RCA with stenoses of 40 1540% in the proximal, mid and mid-distal segment with 50% PDA stenosis.  There is no competitive filling from any graft to any vessel.  Two vein markers are visualized in the aorta.  It is uncertain as to where these grafts had gone and whether or not they were sequential grafts.  Both grafts are occluded at its origin.  The left internal mammary artery was injected but apparently this did not anastomose into any coronary vessel.  RECOMMENDATION: Medical therapy.  I will start patient on aspirin and Plavix.  Patient will be started on aggressive lipid-lowering therapy.  Nitrates will be added to medical regimen with beta blocker therapy with consideration of possible amlodipine.  We will try to obtain the surgical vascular records which are unavailable in Epic.   Patient Profile     64 y.o. female with PMH of CAD s/p CABG in 1998 with prior PCI to LAD, HTN, HLD, ischemic cardiomyopathy (EF 40-45% in 2018), fibromyalgia, and obesity, who is being followed by cardiology for the evaluation of chest pain.   Assessment & Plan    1. NSTEMI in patient with CAD s/p CABG: patient presented with chest pain. She has been non-compliant with medications for quite some time due to financial struggles. Recently sold a beach house and acquired insurance 1 month ago. She is motivated to be compliant with medications and ask for help if needed. HsTrop trend: [phone]. EKG today with new anterolateral TWI compared to yesterday. She was loaded with plavix and restarted on aspirin and statin last night. - Patient would benefit from definitive ischemic evaluation.  The patient understands that risks included but are not limited to stroke (1 in 1000), death (1 in 1000), kidney failure [usually temporary] (1 in 500), bleeding (1 in 200), allergic reaction [possibly serious] (1 in 200).  The patient understands and agrees to proceed.  - Plan for LHC today - keep NPO - Continue aspirin, plavix, and statin - Will add on HgbA1C for risk stratification  2. HTN: BP significantly improved with restarting metoprolol succinate 50mg  daily - Continue metoprolol  3. HLD: LDL poorly controlled at 162. Restarted on atorvastatin last night - Continue statin  4. Ischemic cardiomyopathy: EF 40-45% in 2018. She appears euvolemic on exam - Will update an echo - Continue metoprolol succinate - Further management pending above work-up.      For questions or updates, please contact [place] Please consult [email or web address] for contact info under Cardiology/STEMI.      Signed, Abigail Butts, PA-C  07/31/2019, 8:42 AM   [phone]  Personally seen and examined. Agree with above.   Fairly comfortable in bed currently.  High-sensitivity troponin increased to 5000.  We will go ahead and proceed with cardiac catheterization.  Risk and benefits have been explained.  She is willing to proceed.  Social determinants of health discussed, worried about financial burden.  Recently sold her beach trailer.  She has health insurance currently.  Prior catheterization reviewed.  Obviously there may not be a good target for PCI.  Continue with aspirin Plavix metoprolol atorvastatin and heparin IV.  PPI for GERD.  Candee Furbish, MD

## 2019-07-31 NOTE — Telephone Encounter (Signed)
Per Judy Pimple, patient will see Dr. Bing Matter in [place] on 08/12/19

## 2019-07-31 NOTE — Discharge Instructions (Signed)

## 2019-07-31 NOTE — Progress Notes (Signed)
  Echocardiogram 2D Echocardiogram has been performed.  Sharon Saunders 07/31/2019, 12:06 PM

## 2019-07-31 NOTE — H&P (View-Only) (Signed)
Progress Note  Patient Name: Sharon Saunders Date of Encounter: 07/31/2019  Primary Cardiologist: Nicki Guadalajara, MD   Subjective   Still with some reflux like discomfort in her chest this morning. Pain has improved from admission. She is motivated to improve her health. She would like to follow with [facility] in [place].   Inpatient Medications    Scheduled Meds: . aspirin  81 mg Oral Daily  . atorvastatin  80 mg Oral q1800  . clopidogrel  75 mg Oral Daily  . metoprolol succinate  50 mg Oral Daily  . pantoprazole  40 mg Oral Daily   Continuous Infusions: . heparin 900 Units/hr (07/30/19 2008)  . nitroGLYCERIN 10 mcg/min (07/31/19 0343)   PRN Meds: acetaminophen, meclizine, nitroGLYCERIN, ondansetron (ZOFRAN) IV, zolpidem   Vital Signs    Vitals:   07/31/19 0159 07/31/19 0248 07/31/19 0338 07/31/19 0611  BP: 136/85  117/75 116/73  Pulse:   72   Resp:   20   Temp:   98.3 F (36.8 C)   TempSrc:   Oral   SpO2:   97%   Weight:  87.4 kg    Height:        Intake/Output Summary (Last 24 hours) at 07/31/2019 0842 Last data filed at 07/31/2019 0300 Gross per 24 hour  Intake 370.34 ml  Output --  Net 370.34 ml   Filed Weights   07/30/19 1900 07/30/19 1923 07/31/19 0248  Weight: 90.3 kg 90.3 kg 87.4 kg    Telemetry    Sinus rhythm with occasional PVC - Personally Reviewed  ECG    Sinus rhythm with rate 72 bpm, new TWI in anterolateral leads, no STE/D - Personally Reviewed  Physical Exam   GEN: Obese female sitting upright in bed in no acute distress.   Neck: No JVD, no carotid bruits Cardiac: RRR, no murmurs, rubs, or gallops.  Respiratory: Clear to auscultation bilaterally, no wheezes/ rales/ rhonchi GI: NABS, Soft, obese, nontender, non-distended  MS: No edema; No deformity. Neuro:  Nonfocal, moving all extremities spontaneously Psych: Normal affect   Labs    Chemistry Recent Labs  Lab 07/30/19 1530 07/30/19 1743 07/31/19 0012  NA 140  --   138  K 3.6  --  3.3*  CL 103  --  101  CO2 24  --  25  GLUCOSE 117*  --  109*  BUN 7*  --  5*  CREATININE 0.77  --  0.61  CALCIUM 9.1  --  8.9  PROT  --  7.0 6.3*  ALBUMIN  --  4.0 3.7  AST  --  25 52*  ALT  --  18 20  ALKPHOS  --  83 73  BILITOT  --  0.5 1.4*  GFRNONAA >60  --  >60  GFRAA >60  --  >60  ANIONGAP 13  --  12     Hematology Recent Labs  Lab 07/30/19 1530 07/31/19 0012  WBC 7.2 9.9  RBC 4.42 4.34  HGB 14.1 13.5  HCT 42.4 40.7  MCV 95.9 93.8  MCH 31.9 31.1  MCHC 33.3 33.2  RDW 12.4 12.2  PLT 216 230    Cardiac EnzymesNo results for input(s): TROPONINI in the last 168 hours. No results for input(s): TROPIPOC in the last 168 hours.   BNPNo results for input(s): BNP, PROBNP in the last 168 hours.   DDimer No results for input(s): DDIMER in the last 168 hours.   Radiology    DG Chest 2  View  Result Date: 07/30/2019 CLINICAL DATA:  Chest pain starting 1 hour ago EXAM: CHEST - 2 VIEW COMPARISON:  January 04, 2014 FINDINGS: The heart size and mediastinal contours are stable. Both lungs are clear. The visualized skeletal structures are unremarkable. IMPRESSION: No active cardiopulmonary disease. Electronically Signed   By: Wei-Chen  Lin M.D.   On: 07/30/2019 15:38    Cardiac Studies   Left heart catheterization 2018:  LM-2 lesion, 50 %stenosed.  LM-1 lesion, 20 %stenosed.  Ost 1st Diag lesion, 100 %stenosed.  Prox LAD to Mid LAD lesion, 80 %stenosed.  Mid LAD to Dist LAD lesion, 80 %stenosed.  Dist LAD lesion, 90 %stenosed.  3rd Mrg lesion, 40 %stenosed.  Ost 3rd Mrg lesion, 30 %stenosed.  Prox RCA lesion, 40 %stenosed.  Mid RCA lesion, 50 %stenosed.  Dist RCA lesion, 40 %stenosed.  RPDA lesion, 50 %stenosed.   Moderate global LV dysfunction with an ejection fraction of 40-45% with moderate hypocontractility involving the mid distal anterolateral wall and focal aneurysmal apical segment.  Severe multivessel native CAD with 20% smooth  distal left main tapering, 20 and 50% very proximal LAD stenoses, with diffuse 80% in-stent restenosis in the mid LAD.  Comment diffuse irregularity and narrowing of the mid distal LAD of 80% which reaches 90% apically and a small caliber; 30 and 50% stenoses in the circumflex marginal vessel; and diffusely irregular dominant RCA with stenoses of 40 1540% in the proximal, mid and mid-distal segment with 50% PDA stenosis.  There is no competitive filling from any graft to any vessel.  Two vein markers are visualized in the aorta.  It is uncertain as to where these grafts had gone and whether or not they were sequential grafts.  Both grafts are occluded at its origin.  The left internal mammary artery was injected but apparently this did not anastomose into any coronary vessel.  RECOMMENDATION: Medical therapy.  I will start patient on aspirin and Plavix.  Patient will be started on aggressive lipid-lowering therapy.  Nitrates will be added to medical regimen with beta blocker therapy with consideration of possible amlodipine.  We will try to obtain the surgical vascular records which are unavailable in Epic.   Patient Profile     64 y.o. female with PMH of CAD s/p CABG in 1998 with prior PCI to LAD, HTN, HLD, ischemic cardiomyopathy (EF 40-45% in 2018), fibromyalgia, and obesity, who is being followed by cardiology for the evaluation of chest pain.   Assessment & Plan    1. NSTEMI in patient with CAD s/p CABG: patient presented with chest pain. She has been non-compliant with medications for quite some time due to financial struggles. Recently sold a beach house and acquired insurance 1 month ago. She is motivated to be compliant with medications and ask for help if needed. HsTrop trend: [id number]. EKG today with new anterolateral TWI compared to yesterday. She was loaded with plavix and restarted on aspirin and statin last night. - Patient would benefit from definitive ischemic evaluation.  The patient understands that risks included but are not limited to stroke (1 in 1000), death (1 in 1000), kidney failure [usually temporary] (1 in 500), bleeding (1 in 200), allergic reaction [possibly serious] (1 in 200).  The patient understands and agrees to proceed.  - Plan for LHC today - keep NPO - Continue aspirin, plavix, and statin - Will add on HgbA1C for risk stratification  2. HTN: BP significantly improved with restarting metoprolol succinate 50mg daily - Continue metoprolol    3. HLD: LDL poorly controlled at 162. Restarted on atorvastatin last night - Continue statin  4. Ischemic cardiomyopathy: EF 40-45% in 2018. She appears euvolemic on exam - Will update an echo - Continue metoprolol succinate - Further management pending above work-up.      For questions or updates, please contact [place] Please consult [email or web address] for contact info under Cardiology/STEMI.      Signed, Abigail Butts, PA-C  07/31/2019, 8:42 AM   [phone]  Personally seen and examined. Agree with above.   Fairly comfortable in bed currently.  High-sensitivity troponin increased to 5000.  We will go ahead and proceed with cardiac catheterization.  Risk and benefits have been explained.  She is willing to proceed.  Social determinants of health discussed, worried about financial burden.  Recently sold her beach trailer.  She has health insurance currently.  Prior catheterization reviewed.  Obviously there may not be a good target for PCI.  Continue with aspirin Plavix metoprolol atorvastatin and heparin IV.  PPI for GERD.  Candee Furbish, MD

## 2019-07-31 NOTE — Interval H&P Note (Signed)
History and Physical Interval Note:  07/31/2019 2:18 PM  Sharon Saunders  has presented today for surgery, with the diagnosis of angina.  The various methods of treatment have been discussed with the patient and family. After consideration of risks, benefits and other options for treatment, the patient has consented to  Procedure(s): LEFT HEART CATH AND CORS/GRAFTS ANGIOGRAPHY (N/A) as a surgical intervention.  The patient's history has been reviewed, patient examined, no change in status, stable for surgery.  I have reviewed the patient's chart and labs.  Questions were answered to the patient's satisfaction.   Cath Lab Visit (complete for each Cath Lab visit)  Clinical Evaluation Leading to the Procedure:   ACS: Yes.    Non-ACS:    Anginal Classification: CCS IV  Anti-ischemic medical therapy: No Therapy  Non-Invasive Test Results: No non-invasive testing performed  Prior CABG: Previous CABG        Theron Arista [facility] 07/31/2019 2:18 PM

## 2019-08-01 ENCOUNTER — Other Ambulatory Visit: Payer: Self-pay

## 2019-08-01 LAB — BASIC METABOLIC PANEL
Anion gap: 10 (ref 5–15)
BUN: 6 mg/dL — ABNORMAL LOW (ref 8–23)
CO2: 25 mmol/L (ref 22–32)
Calcium: 9 mg/dL (ref 8.9–10.3)
Chloride: 105 mmol/L (ref 98–111)
Creatinine, Ser: 0.75 mg/dL (ref 0.44–1.00)
GFR calc Af Amer: >60 mL/min (ref >60)
GFR calc non Af Amer: >60 mL/min (ref >60)
Glucose, Bld: 89 mg/dL (ref 70–99)
Potassium: 3.7 mmol/L (ref 3.5–5.1)
Sodium: 140 mmol/L (ref 135–145)

## 2019-08-01 LAB — CBC
HCT: 39.4 % (ref 36.0–46.0)
Hemoglobin: 12.9 g/dL (ref 12.0–15.0)
MCH: 32 pg (ref 26.0–34.0)
MCHC: 32.7 g/dL (ref 30.0–36.0)
MCV: 97.8 fL (ref 80.0–100.0)
Platelets: 224 10*3/uL (ref 150–400)
RBC: 4.03 MIL/uL (ref 3.87–5.11)
RDW: 12.7 % (ref 11.5–15.5)
WBC: 7.2 10*3/uL (ref 4.0–10.5)
nRBC: 0 % (ref 0.0–0.2)

## 2019-08-01 MED ORDER — NITROGLYCERIN 0.4 MG SL SUBL: 0.4000 mg | SUBLINGUAL_TABLET | SUBLINGUAL | 3 refills | Status: DC | PRN

## 2019-08-01 MED ORDER — PANTOPRAZOLE SODIUM 40 MG PO TBEC: 40.0000 mg | DELAYED_RELEASE_TABLET | Freq: Every day | ORAL | 3 refills | Status: DC

## 2019-08-01 MED ORDER — CLOPIDOGREL BISULFATE 75 MG PO TABS: 75.0000 mg | ORAL_TABLET | Freq: Every day | ORAL | 3 refills | Status: DC

## 2019-08-01 MED ORDER — ASPIRIN 81 MG PO CHEW: 81.0000 mg | CHEWABLE_TABLET | Freq: Every day | ORAL | Status: AC

## 2019-08-01 MED ORDER — ATORVASTATIN CALCIUM 80 MG PO TABS: 80.0000 mg | ORAL_TABLET | Freq: Every day | ORAL | 3 refills | Status: DC

## 2019-08-01 MED ORDER — METOPROLOL SUCCINATE ER 50 MG PO TB24: 50.0000 mg | ORAL_TABLET | Freq: Every day | ORAL | 3 refills | Status: DC

## 2019-08-01 MED ORDER — ISOSORBIDE MONONITRATE ER 60 MG PO TB24: 60.0000 mg | ORAL_TABLET | Freq: Every day | ORAL | 3 refills | Status: DC

## 2019-08-01 NOTE — Progress Notes (Signed)
CARDIAC REHAB PHASE I   PRE:  Rate/Rhythm: 79 NSR    BP: sitting 109/75    SaO2: 98 RA  MODE:  Ambulation: 470 ft   POST:  Rate/Rhythm: 78 NSR    BP: sitting 98/62     SaO2: 95 RA  0920-1015 Patient sitting on side of bed eating upon arrival. C/O severe nausea. Primary RN notified and Zofran given. Discharge education completed including review of MI booklet, activity progression, Stent card, Ntg, diet, and restrictions. Patient interested in phase 2 CR. Referral placed to [place] with patient permission. Also discussed VCR (see below). Nausea subsided during education. Patient then ambulated independently in hallway. Steady gait noted. No cardiac complaints. Post ambulation patient back to bedside sitting in preparation of discharge. VSS. Phone and call bell in reach.  Pt is interested in participating in Virtual Cardiac and Pulmonary Rehab. Pt advised that Virtual Cardiac and Pulmonary Rehab is provided at no cost to the patient.  Checklist:  1. Pt has smart device  ie smartphone and/or ipad for downloading an app  Yes 2. Reliable internet/wifi service    Yes 3. Understands how to use their smartphone and navigate within an app.  Yes   Pt verbalized understanding and is in agreement.  Leman Martinek Hoover Brunette RN, BSN

## 2019-08-01 NOTE — Discharge Summary (Signed)
Discharge Summary    Patient ID: Sharon Saunders MRN: [id number]; DOB: [birth year]  Admit date: 07/30/2019 Discharge date: 08/01/2019  Primary Care Provider: Nonnie Done., MD  Primary Cardiologist: Nicki Guadalajara, MD  Primary Electrophysiologist:  Will Jorja Loa, MD  Discharge Diagnoses    Principal Problem:   NSTEMI (non-ST elevated myocardial infarction) [facility]) Active Problems:   Hypertension   CAD (coronary artery disease)   RBBB   Ischemic cardiomyopathy   Obesity   Hyperlipidemia LDL goal <70    Diagnostic Studies/Procedures    Echo 07/31/2019 1. Left ventricular ejection fraction, by estimation, is 40 to 45%. The  left ventricle has mildly decreased function. The left ventricle has no  regional wall motion abnormalities. There is mild concentric left  ventricular hypertrophy. Left ventricular  diastolic parameters are consistent with Grade I diastolic dysfunction  (impaired relaxation). There is severe hypokinesis of the left  ventricular, entire lateral wall.  2. Right ventricular systolic function is moderately reduced. The right  ventricular size is normal. There is normal pulmonary artery systolic  pressure.  3. The mitral valve is normal in structure. No evidence of mitral valve  regurgitation.  4. The aortic valve is normal in structure. Aortic valve regurgitation is  not visualized. No aortic stenosis is present.    Cath 07/31/2019  Prox RCA lesion is 50% stenosed.  Mid RCA lesion is 50% stenosed.  RPDA lesion is 90% stenosed.  Ost LM to Mid LM lesion is 50% stenosed.  Mid LAD lesion is 80% stenosed.  Mid LAD to Dist LAD lesion is 90% stenosed.  Prox Cx to Mid Cx lesion is 95% stenosed.  Post intervention, there is a 0% residual stenosis.  A drug-eluting stent was successfully placed using a SYNERGY XD 3.0X24.  LV end diastolic pressure is mildly elevated.   1. Severe 3 vessel obstructive CAD    - Diffuse 80% mid LAD at site of  prior stent. The mid to distal LAD is diffusely diseased up to 90%.     - 95% mid LCx. This is the culprit lesion    - Diffuse ectasia of the RCA with 50% stenoses. The PDA is diffusely diseased up to 90%  2. 2 Vein grafts are noted to be occluded at proximal aortic anastomosis.  3. The LIMA was never used as a graft and is a large vessel. 4. Mildly elevated LVEDP 5. Successful PCI of the LCx with DES x 1.  Plan: DAPT for at least a year and probably indefinitely given high disease burden and risk. The LAD and PDA disease is not amenable to intervention. Anticipate DC in am. _____________   History of Present Illness     Sharon Saunders is a 64 y.o. female with history of CABG 1998, LAD stent, HTN, HLD, ICM w/ EF 40-45%, RBBB, fibromyalgia.  Sharon Saunders had financial issues after she became a widow. She was not able to afford her meds or MD visits. No rx in 6 months.   Sharon Saunders was in her USOH until today. She was sitting on the couch and started having SSCP. Has not had CP in years. Her CP kept getting worse, up to 9/10. She was a little SOB, no N&V or diaphoresis. She did not have nitro.  At first, she thought it was indigestion because it made her feel like she needed to burp. The pain would wax and wane, but did not resolve.   She called EMS and came to the ER, sx  improved w/ ASA and SL NTG.  The pain has come and gone since she has been in the ER, but she is currently pain-free.  She has had indigestion in the past, but this was different and much worse.  She has not had recent exertional chest pain or shortness of breath.   Hospital Course     Consultants: N/A  Patient was admitted to cardiology service for chest pain and eventually ruled in for NSTEMI by enzymes.  She has been off of her medication for a while due to financial issues.  Echocardiogram obtained on 07/31/2019 showed EF 40 to 45%, mild LVH, severe hypokinesis of the left ventricular lateral wall, moderately  reduced RV EF.  Patient eventually underwent cardiac catheterization on 07/31/2019 which showed 50% proximal RCA lesion, 50% mid RCA lesion, 90% RPDA lesion, 50% ostial left main lesion, diffuse 80 to 90% disease in the mid to distal LAD not amenable to PCI due to long lesion and the diffuse nature, 95% proximal to mid left circumflex lesion that was treated with synergy 3.0 x 24 mm DES.  Two vein grafts were occluded at the proximal aortic anastomosis.  LIMA was never used as a graft.  Postprocedure, patient was kept on aspirin, Plavix, metoprolol and statin.  It is recommended she continue DAPT for at least 1 year, probably indefinitely given high disease burden and risk.  She wished to establish with our  office. Prilosec has been changed to protonix given interaction with Plavix.   Note, her lipid panel was uncontrolled during this admission since she has been off of her cardiac medication prior to arrival.  Lipid panel obtained on 07/31/2019 showed total cholesterol 230, HDL 38, LDL 162, triglyceride 151.  Hemoglobin A1c was normal at 5.0.   Did the patient have an acute coronary syndrome (MI, NSTEMI, STEMI, etc) this admission?:  Yes                               AHA/ACC Clinical Performance & Quality Measures: 1. Aspirin prescribed? - Yes 2. ADP Receptor Inhibitor (Plavix/Clopidogrel, Brilinta/Ticagrelor or Effient/Prasugrel) prescribed (includes medically managed patients)? - Yes 3. Beta Blocker prescribed? - Yes 4. High Intensity Statin (Lipitor 40-80mg  or Crestor 20-40mg ) prescribed? - Yes 5. EF assessed during THIS hospitalization? - Yes 6. For EF <40%, was ACEI/ARB prescribed? - Not Applicable (EF >/= 40%) 7. For EF <40%, Aldosterone Antagonist (Spironolactone or Eplerenone) prescribed? - Not Applicable (EF >/= 40%) 8. Cardiac Rehab Phase II ordered (Included Medically managed Patients)? - Yes   _____________  Discharge Vitals Blood pressure 99/71, pulse 78, temperature 98.6 F  (37 C), temperature source Oral, resp. rate 16, height 5\' 4"  (1.626 m), weight 85.9 kg, SpO2 96 %.  Filed Weights   07/30/19 1923 07/31/19 0248 08/01/19 0539  Weight: 90.3 kg 87.4 kg 85.9 kg    Labs & Radiologic Studies    CBC Recent Labs    07/31/19 0012 08/01/19 0236  WBC 9.9 7.2  HGB 13.5 12.9  HCT 40.7 39.4  MCV 93.8 97.8  PLT 230 224   Basic Metabolic Panel Recent Labs    09/81/1902/05/10 0012 08/01/19 0236  NA 138 140  K 3.3* 3.7  CL 101 105  CO2 25 25  GLUCOSE 109* 89  BUN 5* 6*  CREATININE 0.61 0.75  CALCIUM 8.9 9.0   Liver Function Tests Recent Labs    07/30/19 1743 07/31/19 0012  AST 25 52*  ALT 18 20  ALKPHOS 83 73  BILITOT 0.5 1.4*  PROT 7.0 6.3*  ALBUMIN 4.0 3.7   Recent Labs    07/30/19 1743  LIPASE 21   High Sensitivity Troponin:   Recent Labs  Lab 07/30/19 1530 07/30/19 1743 07/30/19 2244 07/31/19 0012  TROPONINIHS 128* 1,207* 4,216* 5,362*    BNP Invalid input(s): POCBNP D-Dimer No results for input(s): DDIMER in the last 72 hours. Hemoglobin A1C Recent Labs    07/31/19 0012  HGBA1C 5.0   Fasting Lipid Panel Recent Labs    07/31/19 0012  CHOL 230*  HDL 38*  LDLCALC 162*  TRIG 151*  CHOLHDL 6.1   Thyroid Function Tests No results for input(s): TSH, T4TOTAL, T3FREE, THYROIDAB in the last 72 hours.  Invalid input(s): FREET3 _____________  DG Chest 2 View  Result Date: 07/30/2019 CLINICAL DATA:  Chest pain starting 1 hour ago EXAM: CHEST - 2 VIEW COMPARISON:  January 04, 2014 FINDINGS: The heart size and mediastinal contours are stable. Both lungs are clear. The visualized skeletal structures are unremarkable. IMPRESSION: No active cardiopulmonary disease. Electronically Signed   By: Sherian Rein M.D.   On: 07/30/2019 15:38   CARDIAC CATHETERIZATION  Result Date: 07/31/2019  Prox RCA lesion is 50% stenosed.  Mid RCA lesion is 50% stenosed.  RPDA lesion is 90% stenosed.  Ost LM to Mid LM lesion is 50% stenosed.  Mid  LAD lesion is 80% stenosed.  Mid LAD to Dist LAD lesion is 90% stenosed.  Prox Cx to Mid Cx lesion is 95% stenosed.  Post intervention, there is a 0% residual stenosis.  A drug-eluting stent was successfully placed using a SYNERGY XD 3.0X24.  LV end diastolic pressure is mildly elevated.  1. Severe 3 vessel obstructive CAD    - Diffuse 80% mid LAD at site of prior stent. The mid to distal LAD is diffusely diseased up to 90%.    - 95% mid LCx. This is the culprit lesion    - Diffuse ectasia of the RCA with 50% stenoses. The PDA is diffusely diseased up to 90% 2. 2 Vein grafts are noted to be occluded at proximal aortic anastomosis. 3. The LIMA was never used as a graft and is a large vessel. 4. Mildly elevated LVEDP 5. Successful PCI of the LCx with DES x 1. Plan: DAPT for at least a year and probably indefinitely given high disease burden and risk. The LAD and PDA disease is not amenable to intervention. Anticipate DC in am.   ECHOCARDIOGRAM COMPLETE  Result Date: 07/31/2019    ECHOCARDIOGRAM REPORT   Patient Name:   Sharon Saunders Date of Exam: 07/31/2019 Medical Rec #:  [id number]         Height:       64.0 in Accession #:    [id number]        Weight:       192.7 lb Date of Birth:  May 10, 1956         BSA:          1.926 m Patient Age:    64 years          BP:           123/84 mmHg Patient Gender: F                 HR:           72 bpm. Exam Location:  Inpatient Procedure: 2D Echo, Cardiac Doppler and Color Doppler Indications:  NSTEMI I21.4  History:        Patient has no prior history of Echocardiogram examinations.                 Prior CABG, Arrythmias:RBBB; Risk Factors:Hypertension. Ischemic                 cardiomyopathy.  Sonographer:    Ross Ludwig RDCS (AE) Referring Phys: [id number] Beatriz Stallion IMPRESSIONS  1. Left ventricular ejection fraction, by estimation, is 40 to 45%. The left ventricle has mildly decreased function. The left ventricle has no regional wall motion abnormalities. There  is mild concentric left ventricular hypertrophy. Left ventricular diastolic parameters are consistent with Grade I diastolic dysfunction (impaired relaxation). There is severe hypokinesis of the left ventricular, entire lateral wall.  2. Right ventricular systolic function is moderately reduced. The right ventricular size is normal. There is normal pulmonary artery systolic pressure.  3. The mitral valve is normal in structure. No evidence of mitral valve regurgitation.  4. The aortic valve is normal in structure. Aortic valve regurgitation is not visualized. No aortic stenosis is present. FINDINGS  Left Ventricle: Left ventricular ejection fraction, by estimation, is 40 to 45%. The left ventricle has mildly decreased function. The left ventricle has no regional wall motion abnormalities. Severe hypokinesis of the left ventricular, entire lateral wall. The left ventricular internal cavity size was normal in size. There is mild concentric left ventricular hypertrophy. Left ventricular diastolic parameters are consistent with Grade I diastolic dysfunction (impaired relaxation). Right Ventricle: The right ventricular size is normal. No increase in right ventricular wall thickness. Right ventricular systolic function is moderately reduced. There is normal pulmonary artery systolic pressure. The tricuspid regurgitant velocity is 2.24 m/s, and with an assumed right atrial pressure of 3 mmHg, the estimated right ventricular systolic pressure is 23.1 mmHg. Left Atrium: Left atrial size was normal in size. Right Atrium: Right atrial size was normal in size. Pericardium: There is no evidence of pericardial effusion. Mitral Valve: The mitral valve is normal in structure. No evidence of mitral valve regurgitation. Tricuspid Valve: The tricuspid valve is normal in structure. Tricuspid valve regurgitation is not demonstrated. No evidence of tricuspid stenosis. Aortic Valve: The aortic valve is normal in structure. Aortic valve  regurgitation is not visualized. No aortic stenosis is present. Aortic valve mean gradient measures 2.0 mmHg. Aortic valve peak gradient measures 4.3 mmHg. Aortic valve area, by VTI measures 1.95 cm. Pulmonic Valve: The pulmonic valve was normal in structure. Pulmonic valve regurgitation is not visualized. No evidence of pulmonic stenosis. Aorta: The aortic root and ascending aorta are structurally normal, with no evidence of dilitation. IAS/Shunts: The atrial septum is grossly normal.  LEFT VENTRICLE PLAX 2D LVIDd:         4.18 cm     Diastology LVIDs:         2.88 cm     LV e' lateral:   6.31 cm/s LV PW:         1.22 cm     LV E/e' lateral: 3.6 LV IVS:        1.28 cm     LV e' medial:    6.53 cm/s LVOT diam:     2.10 cm     LV E/e' medial:  3.5 LV SV:         39 LV SV Index:   21 LVOT Area:     3.46 cm  LV Volumes (MOD) LV vol d, MOD A2C: 66.2 ml  LV vol d, MOD A4C: 98.7 ml LV vol s, MOD A2C: 31.2 ml LV vol s, MOD A4C: 46.9 ml LV SV MOD A2C:     35.0 ml LV SV MOD A4C:     98.7 ml LV SV MOD BP:      42.3 ml RIGHT VENTRICLE            IVC RV Basal diam:  2.69 cm    IVC diam: 1.36 cm RV S prime:     6.53 cm/s TAPSE (M-mode): 1.0 cm LEFT ATRIUM             Index       RIGHT ATRIUM           Index LA diam:        2.70 cm 1.40 cm/m  RA Area:     12.70 cm LA Vol (A2C):   29.1 ml 15.11 ml/m RA Volume:   26.60 ml  13.81 ml/m LA Vol (A4C):   25.6 ml 13.29 ml/m LA Biplane Vol: 28.9 ml 15.01 ml/m  AORTIC VALVE AV Area (Vmax):    2.35 cm AV Area (Vmean):   2.26 cm AV Area (VTI):     1.95 cm AV Vmax:           104.00 cm/s AV Vmean:          72.000 cm/s AV VTI:            0.203 m AV Peak Grad:      4.3 mmHg AV Mean Grad:      2.0 mmHg LVOT Vmax:         70.50 cm/s LVOT Vmean:        47.000 cm/s LVOT VTI:          0.114 m LVOT/AV VTI ratio: 0.56  AORTA Ao Root diam: 3.30 cm Ao Asc diam:  3.40 cm MITRAL VALVE               TRICUSPID VALVE MV Area (PHT): 6.12 cm    TR Peak grad:   20.1 mmHg MV Decel Time: 124 msec    TR  Vmax:        224.00 cm/s MV E velocity: 22.70 cm/s MV A velocity: 85.30 cm/s  SHUNTS MV E/A ratio:  0.27        Systemic VTI:  0.11 m                            Systemic Diam: 2.10 cm Kristeen Miss MD Electronically signed by Kristeen Miss MD Signature Date/Time: 07/31/2019/1:10:31 PM    Final    Disposition   Pt is being discharged home today in good condition.  Follow-up Plans & Appointments    Follow-up Information    Georgeanna Lea, MD Follow up on 08/12/2019.   Specialty: Cardiology Why: Please arrive 15 minutes early for your 2:55pm post-hospital cardiology follow-up appointment Contact information: [address] [place] [address] [phone]          Discharge Instructions    Amb Referral to Cardiac Rehabilitation   Complete by: As directed    Diagnosis:  Coronary Stents NSTEMI     After initial evaluation and assessments completed: Virtual Based Care may be provided alone or in conjunction with Phase 2 Cardiac Rehab based on patient barriers.: Yes   Diet - low sodium heart healthy   Complete by: As directed    Discharge instructions  Complete by: As directed    No driving for 24 hours. No lifting over 5 lbs for 1 week. No sexual activity for 1 week. Keep procedure site clean & dry. If you notice increased pain, swelling, bleeding or pus, call/return!  You may shower, but no soaking baths/hot tubs/pools for 1 week.   Increase activity slowly   Complete by: As directed       Discharge Medications   Allergies as of 08/01/2019      Reactions   Phenergan [promethazine] Anxiety, Rash, Other (See Comments)   Received a shot an an E.D.and immediately became very jittery   Sulfa Antibiotics Rash      Medication List    STOP taking these medications   omeprazole 20 MG tablet Commonly known as: PRILOSEC OTC     TAKE these medications   acetaminophen 500 MG tablet Commonly known as: TYLENOL Take 500-1,000 mg by mouth every 6 (six) hours as needed  (for headaches or pain).   aspirin 81 MG chewable tablet Chew 1 tablet (81 mg total) by mouth daily. What changed:   how much to take  when to take this  reasons to take this   atorvastatin 80 MG tablet Commonly known as: LIPITOR Take 1 tablet (80 mg total) by mouth daily at 6 PM.   clopidogrel 75 MG tablet Commonly known as: PLAVIX Take 1 tablet (75 mg total) by mouth daily. What changed: See the new instructions.   isosorbide mononitrate 60 MG 24 hr tablet Commonly known as: IMDUR Take 1 tablet (60 mg total) by mouth daily.   metoprolol succinate 50 MG 24 hr tablet Commonly known as: TOPROL-XL Take 1 tablet (50 mg total) by mouth daily.   nitroGLYCERIN 0.4 MG SL tablet Commonly known as: Nitrostat Place 1 tablet (0.4 mg total) under the tongue every 5 (five) minutes as needed for chest pain.   pantoprazole 40 MG tablet Commonly known as: PROTONIX Take 1 tablet (40 mg total) by mouth daily.          Outstanding Labs/Studies   FLP and LFT in 6-8 weeks  Duration of Discharge Encounter   Greater than 30 minutes including physician time.  Hilbert Corrigan, PA 08/01/2019, 1:24 PM

## 2019-08-01 NOTE — Progress Notes (Signed)
Progress Note  Patient Name: TABITHIA STRODER Date of Encounter: 08/01/2019  Primary Cardiologist: Nicki Guadalajara, MD   Subjective   The patient is chest pain free. She would like to follow with [facility] in [place].   Inpatient Medications    Scheduled Meds: . aspirin  81 mg Oral Daily  . atorvastatin  80 mg Oral q1800  . clopidogrel  75 mg Oral Daily  . heparin  5,000 Units Subcutaneous Q8H  . isosorbide mononitrate  60 mg Oral Daily  . metoprolol succinate  50 mg Oral Daily  . pantoprazole  40 mg Oral Daily  . sodium chloride flush  3 mL Intravenous Q12H   Continuous Infusions: . sodium chloride     PRN Meds: sodium chloride, acetaminophen, meclizine, nitroGLYCERIN, ondansetron (ZOFRAN) IV, sodium chloride flush, zolpidem   Vital Signs    Vitals:   07/31/19 1850 07/31/19 2026 08/01/19 0539 08/01/19 0808  BP: 112/66 98/68 102/60 99/71  Pulse: 77 76 75 78  Resp:  17 16   Temp:  98.7 F (37.1 C) 98.6 F (37 C)   TempSrc:  Oral Oral   SpO2: 95% 97% 96%   Weight:   85.9 kg   Height:       No intake or output data in the 24 hours ending 08/01/19 1132 Filed Weights   07/30/19 1923 07/31/19 0248 08/01/19 0539  Weight: 90.3 kg 87.4 kg 85.9 kg    Telemetry    Sinus rhythm with occasional PVC - Personally Reviewed  ECG    Sinus rhythm with rate 72 bpm, new TWI in anterolateral leads, no STE/D - Personally Reviewed  Physical Exam   GEN: Obese female sitting upright in bed in no acute distress.   Neck: No JVD, no carotid bruits Cardiac: RRR, no murmurs, rubs, or gallops.  Respiratory: Clear to auscultation bilaterally, no wheezes/ rales/ rhonchi GI: NABS, Soft, obese, nontender, non-distended  MS: No edema; No deformity. Neuro:  Nonfocal, moving all extremities spontaneously Psych: Normal affect   Labs    Chemistry Recent Labs  Lab 07/30/19 1530 07/30/19 1743 07/31/19 0012 08/01/19 0236  NA 140  --  138 140  K 3.6  --  3.3* 3.7  CL 103  --   101 105  CO2 24  --  25 25  GLUCOSE 117*  --  109* 89  BUN 7*  --  5* 6*  CREATININE 0.77  --  0.61 0.75  CALCIUM 9.1  --  8.9 9.0  PROT  --  7.0 6.3*  --   ALBUMIN  --  4.0 3.7  --   AST  --  25 52*  --   ALT  --  18 20  --   ALKPHOS  --  83 73  --   BILITOT  --  0.5 1.4*  --   GFRNONAA >60  --  >60 >60  GFRAA >60  --  >60 >60  ANIONGAP 13  --  12 10     Hematology Recent Labs  Lab 07/30/19 1530 07/31/19 0012 08/01/19 0236  WBC 7.2 9.9 7.2  RBC 4.42 4.34 4.03  HGB 14.1 13.5 12.9  HCT 42.4 40.7 39.4  MCV 95.9 93.8 97.8  MCH 31.9 31.1 32.0  MCHC 33.3 33.2 32.7  RDW 12.4 12.2 12.7  PLT 216 230 224    Cardiac EnzymesNo results for input(s): TROPONINI in the last 168 hours. No results for input(s): TROPIPOC in the last 168 hours.   BNPNo  results for input(s): BNP, PROBNP in the last 168 hours.   DDimer No results for input(s): DDIMER in the last 168 hours.   Radiology    DG Chest 2 View  Result Date: 07/30/2019 CLINICAL DATA:  Chest pain starting 1 hour ago EXAM: CHEST - 2 VIEW COMPARISON:  January 04, 2014 FINDINGS: The heart size and mediastinal contours are stable. Both lungs are clear. The visualized skeletal structures are unremarkable. IMPRESSION: No active cardiopulmonary disease. Electronically Signed   By: Sherian ReinWei-Chen  Lin M.D.   On: 07/30/2019 15:38   CARDIAC CATHETERIZATION  Result Date: 07/31/2019  Prox RCA lesion is 50% stenosed.  Mid RCA lesion is 50% stenosed.  RPDA lesion is 90% stenosed.  Ost LM to Mid LM lesion is 50% stenosed.  Mid LAD lesion is 80% stenosed.  Mid LAD to Dist LAD lesion is 90% stenosed.  Prox Cx to Mid Cx lesion is 95% stenosed.  Post intervention, there is a 0% residual stenosis.  A drug-eluting stent was successfully placed using a SYNERGY XD 3.0X24.  LV end diastolic pressure is mildly elevated.  1. Severe 3 vessel obstructive CAD    - Diffuse 80% mid LAD at site of prior stent. The mid to distal LAD is diffusely diseased up to  90%.    - 95% mid LCx. This is the culprit lesion    - Diffuse ectasia of the RCA with 50% stenoses. The PDA is diffusely diseased up to 90% 2. 2 Vein grafts are noted to be occluded at proximal aortic anastomosis. 3. The LIMA was never used as a graft and is a large vessel. 4. Mildly elevated LVEDP 5. Successful PCI of the LCx with DES x 1. Plan: DAPT for at least a year and probably indefinitely given high disease burden and risk. The LAD and PDA disease is not amenable to intervention. Anticipate DC in am.   ECHOCARDIOGRAM COMPLETE  Result Date: 07/31/2019    ECHOCARDIOGRAM REPORT   Patient Name:   Christel MormonDEBORAH L Pelcher Date of Exam: 07/31/2019 Medical Rec #:  [id number]         Height:       64.0 in Accession #:    [id number]        Weight:       192.7 lb Date of Birth:  [birth year]         BSA:          1.926 m Patient Age:    64 years          BP:           123/84 mmHg Patient Gender: F                 HR:           72 bpm. Exam Location:  Inpatient Procedure: 2D Echo, Cardiac Doppler and Color Doppler Indications:    NSTEMI I21.4  History:        Patient has no prior history of Echocardiogram examinations.                 Prior CABG, Arrythmias:RBBB; Risk Factors:Hypertension. Ischemic                 cardiomyopathy.  Sonographer:    Ross LudwigArthur Guy RDCS (AE) Referring Phys: [id number] Beatriz StallionKRISTA M. KROEGER IMPRESSIONS  1. Left ventricular ejection fraction, by estimation, is 40 to 45%. The left ventricle has mildly decreased function. The left ventricle has no regional wall motion abnormalities. There is mild  concentric left ventricular hypertrophy. Left ventricular diastolic parameters are consistent with Grade I diastolic dysfunction (impaired relaxation). There is severe hypokinesis of the left ventricular, entire lateral wall.  2. Right ventricular systolic function is moderately reduced. The right ventricular size is normal. There is normal pulmonary artery systolic pressure.  3. The mitral valve is normal in  structure. No evidence of mitral valve regurgitation.  4. The aortic valve is normal in structure. Aortic valve regurgitation is not visualized. No aortic stenosis is present. FINDINGS  Left Ventricle: Left ventricular ejection fraction, by estimation, is 40 to 45%. The left ventricle has mildly decreased function. The left ventricle has no regional wall motion abnormalities. Severe hypokinesis of the left ventricular, entire lateral wall. The left ventricular internal cavity size was normal in size. There is mild concentric left ventricular hypertrophy. Left ventricular diastolic parameters are consistent with Grade I diastolic dysfunction (impaired relaxation). Right Ventricle: The right ventricular size is normal. No increase in right ventricular wall thickness. Right ventricular systolic function is moderately reduced. There is normal pulmonary artery systolic pressure. The tricuspid regurgitant velocity is 2.24 m/s, and with an assumed right atrial pressure of 3 mmHg, the estimated right ventricular systolic pressure is 23.1 mmHg. Left Atrium: Left atrial size was normal in size. Right Atrium: Right atrial size was normal in size. Pericardium: There is no evidence of pericardial effusion. Mitral Valve: The mitral valve is normal in structure. No evidence of mitral valve regurgitation. Tricuspid Valve: The tricuspid valve is normal in structure. Tricuspid valve regurgitation is not demonstrated. No evidence of tricuspid stenosis. Aortic Valve: The aortic valve is normal in structure. Aortic valve regurgitation is not visualized. No aortic stenosis is present. Aortic valve mean gradient measures 2.0 mmHg. Aortic valve peak gradient measures 4.3 mmHg. Aortic valve area, by VTI measures 1.95 cm. Pulmonic Valve: The pulmonic valve was normal in structure. Pulmonic valve regurgitation is not visualized. No evidence of pulmonic stenosis. Aorta: The aortic root and ascending aorta are structurally normal, with no  evidence of dilitation. IAS/Shunts: The atrial septum is grossly normal.  LEFT VENTRICLE PLAX 2D LVIDd:         4.18 cm     Diastology LVIDs:         2.88 cm     LV e' lateral:   6.31 cm/s LV PW:         1.22 cm     LV E/e' lateral: 3.6 LV IVS:        1.28 cm     LV e' medial:    6.53 cm/s LVOT diam:     2.10 cm     LV E/e' medial:  3.5 LV SV:         39 LV SV Index:   21 LVOT Area:     3.46 cm  LV Volumes (MOD) LV vol d, MOD A2C: 66.2 ml LV vol d, MOD A4C: 98.7 ml LV vol s, MOD A2C: 31.2 ml LV vol s, MOD A4C: 46.9 ml LV SV MOD A2C:     35.0 ml LV SV MOD A4C:     98.7 ml LV SV MOD BP:      42.3 ml RIGHT VENTRICLE            IVC RV Basal diam:  2.69 cm    IVC diam: 1.36 cm RV S prime:     6.53 cm/s TAPSE (M-mode): 1.0 cm LEFT ATRIUM  Index       RIGHT ATRIUM           Index LA diam:        2.70 cm 1.40 cm/m  RA Area:     12.70 cm LA Vol (A2C):   29.1 ml 15.11 ml/m RA Volume:   26.60 ml  13.81 ml/m LA Vol (A4C):   25.6 ml 13.29 ml/m LA Biplane Vol: 28.9 ml 15.01 ml/m  AORTIC VALVE AV Area (Vmax):    2.35 cm AV Area (Vmean):   2.26 cm AV Area (VTI):     1.95 cm AV Vmax:           104.00 cm/s AV Vmean:          72.000 cm/s AV VTI:            0.203 m AV Peak Grad:      4.3 mmHg AV Mean Grad:      2.0 mmHg LVOT Vmax:         70.50 cm/s LVOT Vmean:        47.000 cm/s LVOT VTI:          0.114 m LVOT/AV VTI ratio: 0.56  AORTA Ao Root diam: 3.30 cm Ao Asc diam:  3.40 cm MITRAL VALVE               TRICUSPID VALVE MV Area (PHT): 6.12 cm    TR Peak grad:   20.1 mmHg MV Decel Time: 124 msec    TR Vmax:        224.00 cm/s MV E velocity: 22.70 cm/s MV A velocity: 85.30 cm/s  SHUNTS MV E/A ratio:  0.27        Systemic VTI:  0.11 m                            Systemic Diam: 2.10 cm Kristeen Miss MD Electronically signed by Kristeen Miss MD Signature Date/Time: 07/31/2019/1:10:31 PM    Final     Cardiac Studies   Left heart catheterization 2018:  LM-2 lesion, 50 %stenosed.  LM-1 lesion, 20  %stenosed.  Ost 1st Diag lesion, 100 %stenosed.  Prox LAD to Mid LAD lesion, 80 %stenosed.  Mid LAD to Dist LAD lesion, 80 %stenosed.  Dist LAD lesion, 90 %stenosed.  3rd Mrg lesion, 40 %stenosed.  Ost 3rd Mrg lesion, 30 %stenosed.  Prox RCA lesion, 40 %stenosed.  Mid RCA lesion, 50 %stenosed.  Dist RCA lesion, 40 %stenosed.  RPDA lesion, 50 %stenosed.   Moderate global LV dysfunction with an ejection fraction of 40-45% with moderate hypocontractility involving the mid distal anterolateral wall and focal aneurysmal apical segment.  Severe multivessel native CAD with 20% smooth distal left main tapering, 20 and 50% very proximal LAD stenoses, with diffuse 80% in-stent restenosis in the mid LAD.  Comment diffuse irregularity and narrowing of the mid distal LAD of 80% which reaches 90% apically and a small caliber; 30 and 50% stenoses in the circumflex marginal vessel; and diffusely irregular dominant RCA with stenoses of 40 1540% in the proximal, mid and mid-distal segment with 50% PDA stenosis.  There is no competitive filling from any graft to any vessel.  Two vein markers are visualized in the aorta.  It is uncertain as to where these grafts had gone and whether or not they were sequential grafts.  Both grafts are occluded at its origin.  The left internal mammary artery was injected but apparently  this did not anastomose into any coronary vessel.  RECOMMENDATION: Medical therapy.  I will start patient on aspirin and Plavix.  Patient will be started on aggressive lipid-lowering therapy.  Nitrates will be added to medical regimen with beta blocker therapy with consideration of possible amlodipine.  We will try to obtain the surgical vascular records which are unavailable in Epic.  LHC: 07/31/2019   Prox RCA lesion is 50% stenosed.  Mid RCA lesion is 50% stenosed.  RPDA lesion is 90% stenosed.  Ost LM to Mid LM lesion is 50% stenosed.  Mid LAD lesion is 80%  stenosed.  Mid LAD to Dist LAD lesion is 90% stenosed.  Prox Cx to Mid Cx lesion is 95% stenosed.  Post intervention, there is a 0% residual stenosis.  A drug-eluting stent was successfully placed using a SYNERGY XD 3.0X24.  LV end diastolic pressure is mildly elevated.   1. Severe 3 vessel obstructive CAD    - Diffuse 80% mid LAD at site of prior stent. The mid to distal LAD is diffusely diseased up to 90%.     - 95% mid LCx. This is the culprit lesion    - Diffuse ectasia of the RCA with 50% stenoses. The PDA is diffusely diseased up to 90%  2. 2 Vein grafts are noted to be occluded at proximal aortic anastomosis.  3. The LIMA was never used as a graft and is a large vessel. 4. Mildly elevated LVEDP 5. Successful PCI of the LCx with DES x 1.  Plan: DAPT for at least a year and probably indefinitely given high disease burden and risk. The LAD and PDA disease is not amenable to intervention. Anticipate DC in am.   Patient Profile     64 y.o. female with PMH of CAD s/p CABG in 1998 with prior PCI to LAD, HTN, HLD, ischemic cardiomyopathy (EF 40-45% in 2018), fibromyalgia, and obesity, who is being followed by cardiology for the evaluation of chest pain.   Assessment & Plan    1. NSTEMI in patient with CAD s/p CABG: patient presented with chest pain. She has been non-compliant with medications for quite some time due to financial struggles. Recently sold a beach house and acquired insurance 1 month ago. She is motivated to be compliant with medications and ask for help if needed. HsTrop trend: [phone]. EKG today with new anterolateral TWI compared to yesterday. She was loaded with plavix and restarted on aspirin and statin last night. - Patient would benefit from definitive ischemic evaluation. The patient understands that risks included but are not limited to stroke (1 in 1000), death (1 in 33), kidney failure [usually temporary] (1 in 500), bleeding (1 in 200), allergic  reaction [possibly serious] (1 in 200).  The patient understands and agrees to proceed.  - Plan for LHC today - keep NPO - Continue aspirin, plavix, and statin - Will add on HgbA1C for risk stratification  2. HTN: BP significantly improved with restarting metoprolol succinate 50mg  daily - Continue metoprolol  3. HLD: LDL poorly controlled at 162. Restarted on atorvastatin last night - Continue statin  4. Ischemic cardiomyopathy: EF 40-45% in 2018. She appears euvolemic on exam - Will update an echo - Continue metoprolol succinate - Further management pending above work-up.   She underwent cardiac cath yesterday and is s/p Successful PCI of the LCx with DES x 1. Plan: DAPT for at least a year and probably indefinitely given high disease burden and risk. The LAD and PDA disease  is not amenable to intervention. We will discharge today and arrange for an outpatient follow up in [place], vital are stable, cath access site has no bleeding or bruit.PPI for GERD.  For questions or updates, please contact [facility] Please consult [email or web address] for contact info under Cardiology/STEMI.     Signed, Tobias Alexander, MD  08/01/2019, 11:32 AM   [phone]

## 2019-08-03 NOTE — Telephone Encounter (Signed)
Patient contacted regarding discharge from [facility] on 08/01/19. Called number listed for patient in her chart, [phone] and was advised that the number does not belong to patient; I removed it from her chart Called patient's EC and left message requesting a return call

## 2019-08-04 NOTE — Telephone Encounter (Signed)
Patient's daughter returned call.  She said patient could be reached at [phone].  I told patient's daughter this was for hospital follow up call.  I placed call to patient but voicemail has not been set up

## 2019-08-04 NOTE — Telephone Encounter (Signed)
I placed call to emergency contact for patient and left message to call office.

## 2019-08-04 NOTE — Telephone Encounter (Signed)
Tried again to reach patient. No voicemail

## 2019-08-05 ENCOUNTER — Telehealth (HOSPITAL_COMMUNITY): Payer: Self-pay

## 2019-08-05 NOTE — Telephone Encounter (Signed)
Patient contacted regarding discharge from [facility] on March 13,2021  Patient understands to follow up with provider -yes-Dr Mauri Brooklyn-- March 24,2021 at 2:55.  [facility] [address], [place]  Patient understands discharge instructions? yes Patient understands medications and regiment? yes Patient understands to bring all medications to this visit? yes  Ask patient:  Are you enrolled in My Chart -no.  Patient would like to enroll.  Link to sign up sent to patient by text message

## 2019-08-05 NOTE — Telephone Encounter (Signed)
Attempted to call patient in regards to Cardiac Rehab - unable to leave voicemail.

## 2019-08-05 NOTE — Telephone Encounter (Signed)
Pt insurance is active and benefits verified through [facility]. Co-pay $45.00, DED $0.00/$0.00 met, out of pocket $5,000.00/$0.00 met, co-insurance 0%. No pre-authorization required. [employer]ona[employer], 08/05/2019 @ 846AM, EFU#[id number]  Will contact patient to see if she is interested in the Cardiac Rehab Program. If interested, patient will need to complete follow up appt. Once completed, patient will be contacted for scheduling upon review by the RN Navigator.

## 2019-08-12 ENCOUNTER — Ambulatory Visit: Payer: Medicare HMO | Admitting: Cardiology

## 2019-08-28 ENCOUNTER — Other Ambulatory Visit: Payer: Self-pay

## 2019-08-28 ENCOUNTER — Ambulatory Visit: Payer: Medicare HMO | Admitting: Cardiology

## 2019-08-28 ENCOUNTER — Encounter: Payer: Self-pay | Admitting: Cardiology

## 2019-08-28 VITALS — BP 92/60 | HR 76 | Temp 97.7°F | Ht 65.0 in | Wt 191.0 lb

## 2019-08-28 DIAGNOSIS — I255 Ischemic cardiomyopathy: Secondary | ICD-10-CM

## 2019-08-28 DIAGNOSIS — I25810 Atherosclerosis of coronary artery bypass graft(s) without angina pectoris: Secondary | ICD-10-CM | POA: Diagnosis not present

## 2019-08-28 DIAGNOSIS — Z951 Presence of aortocoronary bypass graft: Secondary | ICD-10-CM

## 2019-08-28 DIAGNOSIS — E785 Hyperlipidemia, unspecified: Secondary | ICD-10-CM

## 2019-08-28 DIAGNOSIS — I214 Non-ST elevation (NSTEMI) myocardial infarction: Secondary | ICD-10-CM | POA: Diagnosis not present

## 2019-08-28 DIAGNOSIS — I4510 Unspecified right bundle-branch block: Secondary | ICD-10-CM | POA: Diagnosis not present

## 2019-08-28 DIAGNOSIS — I10 Essential (primary) hypertension: Secondary | ICD-10-CM | POA: Diagnosis not present

## 2019-08-28 HISTORY — DX: Presence of aortocoronary bypass graft: Z95.1

## 2019-08-28 NOTE — Patient Instructions (Signed)
Medication Instructions:  Your physician recommends that you continue on your current medications as directed. Please refer to the Current Medication list given to you today.  *If you need a refill on your cardiac medications before your next appointment, please call your pharmacy*   Lab Work: Your physician recommends that you return for lab work in 2 weeks fasting: lipids   If you have labs (blood work) drawn today and your tests are completely normal, you will receive your results only by: Marland Kitchen MyChart Message (if you have MyChart) OR . A paper copy in the mail If you have any lab test that is abnormal or we need to change your treatment, we will call you to review the results.   Testing/Procedures: None.    Follow-Up: At [facility], you and your health needs are our priority.  As part of our continuing mission to provide you with exceptional heart care, we have created designated Provider Care Teams.  These Care Teams include your primary Cardiologist (physician) and Advanced Practice Providers (APPs -  Physician Assistants and Nurse Practitioners) who all work together to provide you with the care you need, when you need it.  We recommend signing up for the patient portal called "MyChart".  Sign up information is provided on this After Visit Summary.  MyChart is used to connect with patients for Virtual Visits (Telemedicine).  Patients are able to view lab/test results, encounter notes, upcoming appointments, etc.  Non-urgent messages can be sent to your provider as well.   To learn more about what you can do with MyChart, go to [email or web address].    Your next appointment:   1 month(s)  The format for your next appointment:   In Person  Provider:   Gypsy Balsam, MD   Other Instructions  Dr. Bing Matter has referred you to cardiac rehab they should call you within 1 week for appointment if not please call our office.

## 2019-08-28 NOTE — Progress Notes (Signed)
Cardiology Office Note:    Date:  08/28/2019   ID:  Sharon Saunders, DOB 12-Mar-1956, MRN [id number]  PCP:  Nonnie Done., MD  Cardiologist:  Gypsy Balsam, MD    Referring MD: Nonnie Done., MD   No chief complaint on file. I am doing better  History of Present Illness:    Sharon Saunders is a 64 y.o. female with complex past medical history.  That include coronary artery disease, status post coronary artery bypass graft done in 1998.  Also recently she ended up coming to [facility] when cardiac catheterization was done and stent was placed into that her circumflex artery.  She does have mildly diminished left ventricle ejection fraction with ejection fraction 40 to 45% based on echocardiogram from March 2021, obesity, dyslipidemia, right bundle branch block, essential hypertension.  She comes today to my office for follow-up she is doing better.  She denies have any chest pain she admits that she is somewhat sedentary.  Denies having any swelling of lower extremities.  Past Medical History:  Diagnosis Date  . ACS (acute coronary syndrome) (HCC) 12/28/2016  . Arthritis    osteoarthritis knees  . CAD (coronary artery disease)    a. s/p CABG in 1998;  b. s/p prior LAD stenting;  c. 12/2016 Cath: LM 20/50, LAD 50p, diffuse 37m ISR, 80d, 90apical, D1 100, LCX min irregs, OM3 30/40, RCA 40p, 42m, 40d, RPDA 50. EF 40-45%, mid antlat HK, focal apical aneurysmal segment.  . Fibromyalgia   . Hyperlipidemia   . Hyperlipidemia LDL goal <70 07/30/2019  . Hypertension   . Ischemic cardiomyopathy    a. 12/2016 LV Gram: EF 40-45% w/ moderate mid anterolateral HK and a focal apical aneurysmal segment.  . NSTEMI (non-ST elevated myocardial infarction) (HCC) 07/30/2019  . Obesity   . RBBB   . STEMI (ST elevation myocardial infarction) (HCC) 12/28/2016    Past Surgical History:  Procedure Laterality Date  . APPENDECTOMY    . BACK SURGERY     ruptured disc  . CORONARY ARTERY BYPASS GRAFT     . CORONARY STENT INTERVENTION N/A 07/31/2019   Procedure: CORONARY STENT INTERVENTION;  Surgeon: Swaziland, Peter M, MD;  Location[facility] INVASIVE CV LAB;  Service: Cardiovascular;  Laterality: N/A;  . EYE SURGERY     cataracts  . LEFT HEART CATH AND CORONARY ANGIOGRAPHY N/A 12/28/2016   Procedure: LEFT HEART CATH AND CORONARY ANGIOGRAPHY;  Surgeon: Lennette Bihari, MD;  Location: [facility] INVASIVE CV LAB;  Service: Cardiovascular;  Laterality: N/A;  . LEFT HEART CATH AND CORS/GRAFTS ANGIOGRAPHY N/A 07/31/2019   Procedure: LEFT HEART CATH AND CORS/GRAFTS ANGIOGRAPHY;  Surgeon: Swaziland, Peter M, MD;  Location: [facility] INVASIVE CV LAB;  Service: Cardiovascular;  Laterality: N/A;    Current Medications: Current Meds  Medication Sig  . aspirin 81 MG chewable tablet Chew 1 tablet (81 mg total) by mouth daily.  Marland Kitchen atorvastatin (LIPITOR) 80 MG tablet Take 1 tablet (80 mg total) by mouth daily at 6 PM.  . clopidogrel (PLAVIX) 75 MG tablet Take 1 tablet (75 mg total) by mouth daily.  . isosorbide mononitrate (IMDUR) 60 MG 24 hr tablet Take 1 tablet (60 mg total) by mouth daily.  . metoprolol succinate (TOPROL-XL) 50 MG 24 hr tablet Take 1 tablet (50 mg total) by mouth daily.  . nitroGLYCERIN (NITROSTAT) 0.4 MG SL tablet Place 1 tablet (0.4 mg total) under the tongue every 5 (five) minutes as needed for chest pain.  Marland Kitchen  pantoprazole (PROTONIX) 40 MG tablet Take 1 tablet (40 mg total) by mouth daily.     Allergies:   Phenergan [promethazine] and Sulfa antibiotics   Social History   Socioeconomic History  . Marital status: Widowed    Spouse name: Not on file  . Number of children: Not on file  . Years of education: Not on file  . Highest education level: Not on file  Occupational History  . Not on file  Tobacco Use  . Smoking status: Former Smoker    Quit date: 12/28/1996    Years since quitting: 22.6  . Smokeless tobacco: Former Network engineer and Sexual Activity  . Alcohol use: No  . Drug use: No  .  Sexual activity: Not Currently  Other Topics Concern  . Not on file  Social History Narrative  . Not on file   Social Determinants of Health   Financial Resource Strain:   . Difficulty of Paying Living Expenses:   Food Insecurity:   . Worried About Charity fundraiser in the Last Year:   . Arboriculturist in the Last Year:   Transportation Needs:   . Film/video editor (Medical):   Marland Kitchen Lack of Transportation (Non-Medical):   Physical Activity:   . Days of Exercise per Week:   . Minutes of Exercise per Session:   Stress:   . Feeling of Stress :   Social Connections:   . Frequency of Communication with Friends and Family:   . Frequency of Social Gatherings with Friends and Family:   . Attends Religious Services:   . Active Member of Clubs or Organizations:   . Attends Archivist Meetings:   Marland Kitchen Marital Status:      Family History: The patient's family history is not on file. ROS:   Please see the history of present illness.    All 14 point review of systems negative except as described per history of present illness  EKGs/Labs/Other Studies Reviewed:      Recent Labs: 07/31/2019: ALT 20 08/01/2019: BUN 6; Creatinine, Ser 0.75; Hemoglobin 12.9; Platelets 224; Potassium 3.7; Sodium 140  Recent Lipid Panel    Component Value Date/Time   CHOL 230 (H) 07/31/2019 0012   TRIG 151 (H) 07/31/2019 0012   HDL 38 (L) 07/31/2019 0012   CHOLHDL 6.1 07/31/2019 0012   VLDL 30 07/31/2019 0012   LDLCALC 162 (H) 07/31/2019 0012    Physical Exam:    VS:  BP 92/60   Pulse 76   Temp 97.7 F (36.5 C)   Ht 5\' 5"  (1.651 m)   Wt 191 lb (86.6 kg)   SpO2 95%   BMI 31.78 kg/m     Wt Readings from Last 3 Encounters:  08/28/19 191 lb (86.6 kg)  08/01/19 189 lb 6 oz (85.9 kg)  09/16/17 194 lb (88 kg)     GEN:  Well nourished, well developed in no acute distress HEENT: Normal NECK: No JVD; No carotid bruits LYMPHATICS: No lymphadenopathy CARDIAC: RRR, no murmurs, no  rubs, no gallops RESPIRATORY:  Clear to auscultation without rales, wheezing or rhonchi  ABDOMEN: Soft, non-tender, non-distended MUSCULOSKELETAL:  No edema; No deformity  SKIN: Warm and dry LOWER EXTREMITIES: no swelling NEUROLOGIC:  Alert and oriented x 3 PSYCHIATRIC:  Normal affect   ASSESSMENT:    1. NSTEMI (non-ST elevated myocardial infarction) ([place])   2. Hyperlipidemia LDL goal <70   3. Status post coronary artery bypass graft   4. Coronary  artery disease involving coronary bypass graft of native heart without angina pectoris   5. Essential hypertension   6. Ischemic cardiomyopathy   7. RBBB    PLAN:    In order of problems listed above:  1. Status post non-STEMI status post PTCA and stenting of the circumflex artery with drug-eluting stent.  She need to be on dual antiplatelet therapy for at least 1 year.  Favorably in her situation and advanced coronary artery disease we will have to continue indefinitely with dual antiplatelets therapy. 2. Dyslipidemia we will make arrangements to have her fasting lipid profile rechecked in about 2 weeks. 3. Essential hypertension blood pressure well controlled continue present management. 4. Right bundle branch block.  Noted 5. Ischemic cardiomyopathy on appropriate medications.  In the future we will try to augment that therapy. 6. Her left wrist after cardiac catheterization healing well.  I did review record from [facility] for this visit   Medication Adjustments/Labs and Tests Ordered: Current medicines are reviewed at length with the patient today.  Concerns regarding medicines are outlined above.  Orders Placed This Encounter  Procedures  . Lipid panel  . AMB referral to cardiac rehabilitation   Medication changes: No orders of the defined types were placed in this encounter.   Signed, Georgeanna Lea, MD, [facility] 08/28/2019 12:29 PM    [facility]

## 2019-08-31 ENCOUNTER — Telehealth (HOSPITAL_COMMUNITY): Payer: Self-pay

## 2019-08-31 ENCOUNTER — Encounter (HOSPITAL_COMMUNITY): Payer: Self-pay

## 2019-08-31 NOTE — Telephone Encounter (Signed)
Attempted to call patient in regards to Cardiac Rehab - voicemail not set up, unable to leaved voicemail.  Mailed letter

## 2019-09-15 ENCOUNTER — Telehealth (HOSPITAL_COMMUNITY): Payer: Self-pay

## 2019-09-15 NOTE — Telephone Encounter (Signed)
Unable to reach pt after several attempts in regards to cardiac rehab. Closed referral.

## 2019-09-17 DIAGNOSIS — E785 Hyperlipidemia, unspecified: Secondary | ICD-10-CM | POA: Diagnosis not present

## 2019-09-17 DIAGNOSIS — I214 Non-ST elevation (NSTEMI) myocardial infarction: Secondary | ICD-10-CM | POA: Diagnosis not present

## 2019-09-17 LAB — LIPID PANEL
Chol/HDL Ratio: 3 ratio (ref 0.0–4.4)
Cholesterol, Total: 114 mg/dL (ref 100–199)
HDL: 38 mg/dL — ABNORMAL LOW (ref >39)
LDL Chol Calc (NIH): 52 mg/dL (ref 0–99)
Triglycerides: 140 mg/dL (ref 0–149)
VLDL Cholesterol Cal: 24 mg/dL (ref 5–40)

## 2019-09-25 ENCOUNTER — Other Ambulatory Visit: Payer: Self-pay

## 2019-09-25 ENCOUNTER — Ambulatory Visit: Payer: Medicare HMO | Admitting: Cardiology

## 2019-09-25 ENCOUNTER — Encounter: Payer: Self-pay | Admitting: Cardiology

## 2019-09-25 VITALS — BP 110/76 | HR 94 | Ht 65.0 in | Wt 196.0 lb

## 2019-09-25 DIAGNOSIS — E785 Hyperlipidemia, unspecified: Secondary | ICD-10-CM

## 2019-09-25 DIAGNOSIS — Z951 Presence of aortocoronary bypass graft: Secondary | ICD-10-CM | POA: Diagnosis not present

## 2019-09-25 DIAGNOSIS — I255 Ischemic cardiomyopathy: Secondary | ICD-10-CM

## 2019-09-25 DIAGNOSIS — I10 Essential (primary) hypertension: Secondary | ICD-10-CM | POA: Diagnosis not present

## 2019-09-25 DIAGNOSIS — I25810 Atherosclerosis of coronary artery bypass graft(s) without angina pectoris: Secondary | ICD-10-CM | POA: Diagnosis not present

## 2019-09-25 LAB — BASIC METABOLIC PANEL
BUN/Creatinine Ratio: 9 — ABNORMAL LOW (ref 12–28)
BUN: 6 mg/dL — ABNORMAL LOW (ref 8–27)
CO2: 27 mmol/L (ref 20–29)
Calcium: 9.1 mg/dL (ref 8.7–10.3)
Chloride: 99 mmol/L (ref 96–106)
Creatinine, Ser: 0.64 mg/dL (ref 0.57–1.00)
GFR calc Af Amer: 108 mL/min/{1.73_m2} (ref >59)
GFR calc non Af Amer: 94 mL/min/{1.73_m2} (ref >59)
Glucose: 88 mg/dL (ref 65–99)
Potassium: 3.4 mmol/L — ABNORMAL LOW (ref 3.5–5.2)
Sodium: 142 mmol/L (ref 134–144)

## 2019-09-25 NOTE — Patient Instructions (Signed)
Medication Instructions:  Your physician recommends that you continue on your current medications as directed. Please refer to the Current Medication list given to you today.  *If you need a refill on your cardiac medications before your next appointment, please call your pharmacy*   Lab Work: Your physician recommends that you return for lab work in: bmp    If you have labs (blood work) drawn today and your tests are completely normal, you will receive your results only by: Marland Kitchen MyChart Message (if you have MyChart) OR . A paper copy in the mail If you have any lab test that is abnormal or we need to change your treatment, we will call you to review the results.   Testing/Procedures: None.    Follow-Up: At [facility], you and your health needs are our priority.  As part of our continuing mission to provide you with exceptional heart care, we have created designated Provider Care Teams.  These Care Teams include your primary Cardiologist (physician) and Advanced Practice Providers (APPs -  Physician Assistants and Nurse Practitioners) who all work together to provide you with the care you need, when you need it.  We recommend signing up for the patient portal called "MyChart".  Sign up information is provided on this After Visit Summary.  MyChart is used to connect with patients for Virtual Visits (Telemedicine).  Patients are able to view lab/test results, encounter notes, upcoming appointments, etc.  Non-urgent messages can be sent to your provider as well.   To learn more about what you can do with MyChart, go to [email or web address].    Your next appointment:   4 month(s)  The format for your next appointment:   In Person  Provider:   Gypsy Balsam, MD   Other Instructions

## 2019-09-25 NOTE — Progress Notes (Signed)
Cardiology Office Note:    Date:  09/25/2019   ID:  Sharon Saunders, DOB 1956-03-30, MRN [id number]  PCP:  Nonnie Done., MD  Cardiologist:  Gypsy Balsam, MD    Referring MD: Nonnie Done., MD   No chief complaint on file. I am doing fine  History of Present Illness:    Sharon Saunders is a 64 y.o. female  with complex past medical history.  That include coronary artery disease, status post coronary artery bypass graft done in 1998.  Also recently she ended up coming to [facility] when cardiac catheterization was done and stent was placed into that her circumflex artery.  She does have mildly diminished left ventricle ejection fraction with ejection fraction 40 to 45% based on echocardiogram from March 2021, obesity, dyslipidemia, right bundle branch block, essential hypertension Comes today to my office for follow-up.  Overall she is doing well.  She is trying to exercise on a regular basis.  We signed her up for cardiac rehab however she says she does not want to do it before she participated in that she knows what to do she does have a dog that she walks with and she is preferring to do that rather than go to cardiac rehab.  I explained to her the multiple benefits of cardiac rehab but she still preferred to do it by herself.  Denies have any chest pain tightness squeezing pressure burning chest no shortness of breath no swelling of lower extremities.  Past Medical History:  Diagnosis Date  . ACS (acute coronary syndrome) (HCC) 12/28/2016  . Arthritis    osteoarthritis knees  . CAD (coronary artery disease)    a. s/p CABG in 1998;  b. s/p prior LAD stenting;  c. 12/2016 Cath: LM 20/50, LAD 50p, diffuse 44m ISR, 80d, 90apical, D1 100, LCX min irregs, OM3 30/40, RCA 40p, 84m, 40d, RPDA 50. EF 40-45%, mid antlat HK, focal apical aneurysmal segment.  . Fibromyalgia   . Hyperlipidemia   . Hyperlipidemia LDL goal <70 07/30/2019  . Hypertension   . Ischemic cardiomyopathy    a.  12/2016 LV Gram: EF 40-45% w/ moderate mid anterolateral HK and a focal apical aneurysmal segment.  . NSTEMI (non-ST elevated myocardial infarction) (HCC) 07/30/2019  . Obesity   . RBBB   . Status post coronary artery bypass graft 08/28/2019  . STEMI (ST elevation myocardial infarction) (HCC) 12/28/2016    Past Surgical History:  Procedure Laterality Date  . APPENDECTOMY    . BACK SURGERY     ruptured disc  . CORONARY ARTERY BYPASS GRAFT    . CORONARY STENT INTERVENTION N/A 07/31/2019   Procedure: CORONARY STENT INTERVENTION;  Surgeon: Swaziland, Peter M, MD;  Location: [facility] INVASIVE CV LAB;  Service: Cardiovascular;  Laterality: N/A;  . EYE SURGERY     cataracts  . LEFT HEART CATH AND CORONARY ANGIOGRAPHY N/A 12/28/2016   Procedure: LEFT HEART CATH AND CORONARY ANGIOGRAPHY;  Surgeon: Lennette Bihari, MD;  Location: [facility] INVASIVE CV LAB;  Service: Cardiovascular;  Laterality: N/A;  . LEFT HEART CATH AND CORS/GRAFTS ANGIOGRAPHY N/A 07/31/2019   Procedure: LEFT HEART CATH AND CORS/GRAFTS ANGIOGRAPHY;  Surgeon: Swaziland, Peter M, MD;  Location: [facility] INVASIVE CV LAB;  Service: Cardiovascular;  Laterality: N/A;    Current Medications: Current Meds  Medication Sig  . acetaminophen (TYLENOL) 500 MG tablet Take 500-1,000 mg by mouth every 6 (six) hours as needed (for headaches or pain).   Marland Kitchen aspirin 81 MG chewable  tablet Chew 1 tablet (81 mg total) by mouth daily.  Marland Kitchen atorvastatin (LIPITOR) 80 MG tablet Take 1 tablet (80 mg total) by mouth daily at 6 PM.  . clopidogrel (PLAVIX) 75 MG tablet Take 1 tablet (75 mg total) by mouth daily.  . isosorbide mononitrate (IMDUR) 60 MG 24 hr tablet Take 1 tablet (60 mg total) by mouth daily.  . metoprolol succinate (TOPROL-XL) 50 MG 24 hr tablet Take 1 tablet (50 mg total) by mouth daily.  . nitroGLYCERIN (NITROSTAT) 0.4 MG SL tablet Place 1 tablet (0.4 mg total) under the tongue every 5 (five) minutes as needed for chest pain.  . pantoprazole (PROTONIX) 40 MG tablet Take 1  tablet (40 mg total) by mouth daily.     Allergies:   Phenergan [promethazine] and Sulfa antibiotics   Social History   Socioeconomic History  . Marital status: Widowed    Spouse name: Not on file  . Number of children: Not on file  . Years of education: Not on file  . Highest education level: Not on file  Occupational History  . Not on file  Tobacco Use  . Smoking status: Former Smoker    Quit date: 12/28/1996    Years since quitting: 22.7  . Smokeless tobacco: Former Engineer, water and Sexual Activity  . Alcohol use: No  . Drug use: No  . Sexual activity: Not Currently  Other Topics Concern  . Not on file  Social History Narrative  . Not on file   Social Determinants of Health   Financial Resource Strain:   . Difficulty of Paying Living Expenses:   Food Insecurity:   . Worried About Programme researcher, broadcasting/film/video in the Last Year:   . Barista in the Last Year:   Transportation Needs:   . Freight forwarder (Medical):   Marland Kitchen Lack of Transportation (Non-Medical):   Physical Activity:   . Days of Exercise per Week:   . Minutes of Exercise per Session:   Stress:   . Feeling of Stress :   Social Connections:   . Frequency of Communication with Friends and Family:   . Frequency of Social Gatherings with Friends and Family:   . Attends Religious Services:   . Active Member of Clubs or Organizations:   . Attends Banker Meetings:   Marland Kitchen Marital Status:      Family History: The patient's family history is not on file. ROS:   Please see the history of present illness.    All 14 point review of systems negative except as described per history of present illness  EKGs/Labs/Other Studies Reviewed:      Recent Labs: 07/31/2019: ALT 20 08/01/2019: BUN 6; Creatinine, Ser 0.75; Hemoglobin 12.9; Platelets 224; Potassium 3.7; Sodium 140  Recent Lipid Panel    Component Value Date/Time   CHOL 114 09/17/2019 0912   TRIG 140 09/17/2019 0912   HDL 38 (L)  09/17/2019 0912   CHOLHDL 3.0 09/17/2019 0912   CHOLHDL 6.1 07/31/2019 0012   VLDL 30 07/31/2019 0012   LDLCALC 52 09/17/2019 0912    Physical Exam:    VS:  BP 110/76   Pulse 94   Ht 5\' 5"  (1.651 m)   Wt 196 lb (88.9 kg)   SpO2 98%   BMI 32.62 kg/m     Wt Readings from Last 3 Encounters:  09/25/19 196 lb (88.9 kg)  08/28/19 191 lb (86.6 kg)  08/01/19 189 lb 6 oz (85.9 kg)  GEN:  Well nourished, well developed in no acute distress HEENT: Normal NECK: No JVD; No carotid bruits LYMPHATICS: No lymphadenopathy CARDIAC: RRR, no murmurs, no rubs, no gallops RESPIRATORY:  Clear to auscultation without rales, wheezing or rhonchi  ABDOMEN: Soft, non-tender, non-distended MUSCULOSKELETAL:  No edema; No deformity  SKIN: Warm and dry LOWER EXTREMITIES: no swelling NEUROLOGIC:  Alert and oriented x 3 PSYCHIATRIC:  Normal affect   ASSESSMENT:    1. Coronary artery disease involving coronary bypass graft of native heart without angina pectoris   2. Essential hypertension   3. Status post coronary artery bypass graft   4. Hyperlipidemia LDL goal <70   5. Ischemic cardiomyopathy    PLAN:    In order of problems listed above:  1. Coronary artery disease: Stable from that point of view.  She is on dual antiplatelet therapy which will continue for at least 12 months and maybe even for longer period of time.  Asymptomatic. 2. Essential hypertension blood pressure actually is on the lower side which could be a limiting factor for medications we can use for her cardiomyopathy.  We will continue present management for now. 3. Status post coronary artery bypass graft.  Noted. 4. Hyperlipidemia: She is on high intense statin Lipitor 80 which I will continue.  I did review her fasting lipid profile which is excellent.  We did talk about dieting. 5. Ischemic cardiomyopathy with ejection fraction 4045%, so far she is only on long-acting beta-blocker which she is Toprol-XL.  I will ask her to  have Chem-7 done if Chem-7 is fine then will initiate most likely losartan 25 to 50 mg daily.  What I am worried about is her blood pressure being low and that may be limiting factor in terms of appropriate medication she can use.  We will try to do and see how she tolerate those medications.   Medication Adjustments/Labs and Tests Ordered: Current medicines are reviewed at length with the patient today.  Concerns regarding medicines are outlined above.  Orders Placed This Encounter  Procedures  . Basic metabolic panel   Medication changes: No orders of the defined types were placed in this encounter.   Signed, Park Liter, MD, [facility] 09/25/2019 9:20 AM    [place]

## 2019-10-08 ENCOUNTER — Telehealth: Payer: Self-pay | Admitting: Emergency Medicine

## 2019-10-08 DIAGNOSIS — Z79899 Other long term (current) drug therapy: Secondary | ICD-10-CM

## 2019-10-08 MED ORDER — LOSARTAN POTASSIUM 25 MG PO TABS: 25.0000 mg | ORAL_TABLET | Freq: Every day | ORAL | 1 refills | Status: DC

## 2019-10-08 NOTE — Telephone Encounter (Signed)
Called patient informed her of results and Dr. Vanetta Shawl recommendation for her to start losartan 25 mg daily and then have labs checked 1 week later. Patient verbally understood no further questions.

## 2019-10-13 DIAGNOSIS — H2513 Age-related nuclear cataract, bilateral: Secondary | ICD-10-CM | POA: Diagnosis not present

## 2020-01-27 ENCOUNTER — Ambulatory Visit: Payer: Medicare HMO | Admitting: Cardiology

## 2020-02-24 DIAGNOSIS — E785 Hyperlipidemia, unspecified: Secondary | ICD-10-CM | POA: Insufficient documentation

## 2020-02-24 DIAGNOSIS — M1990 Unspecified osteoarthritis, unspecified site: Secondary | ICD-10-CM | POA: Insufficient documentation

## 2020-02-25 ENCOUNTER — Encounter: Payer: Self-pay | Admitting: Cardiology

## 2020-02-25 ENCOUNTER — Other Ambulatory Visit: Payer: Self-pay

## 2020-02-25 ENCOUNTER — Ambulatory Visit: Payer: Medicare HMO | Admitting: Cardiology

## 2020-02-25 VITALS — BP 106/82 | HR 70 | Ht 65.0 in | Wt 183.0 lb

## 2020-02-25 DIAGNOSIS — I255 Ischemic cardiomyopathy: Secondary | ICD-10-CM

## 2020-02-25 DIAGNOSIS — I25810 Atherosclerosis of coronary artery bypass graft(s) without angina pectoris: Secondary | ICD-10-CM

## 2020-02-25 DIAGNOSIS — Z951 Presence of aortocoronary bypass graft: Secondary | ICD-10-CM | POA: Diagnosis not present

## 2020-02-25 DIAGNOSIS — E785 Hyperlipidemia, unspecified: Secondary | ICD-10-CM | POA: Diagnosis not present

## 2020-02-25 NOTE — Progress Notes (Signed)
Cardiology Office Note:    Date:  02/25/2020   ID:  Sharon Saunders, DOB 12-16-1955, MRN [id number]  PCP:  Nonnie Done., MD  Cardiologist:  Gypsy Balsam, MD    Referring MD: Nonnie Done., MD   Chief Complaint  Patient presents with  . Follow-up  M doing well  History of Present Illness:    Sharon Saunders is a 64 y.o. female  with complex past medical history. That include coronary artery disease, status post coronary artery bypass graft done in 1998. Also recently she ended up coming to [facility] when cardiac catheterization was done and stent was placed into that her circumflex artery. She does have mildly diminished left ventricle ejection fraction with ejection fraction 40 to 45% based on echocardiogram from March 2021, obesity, dyslipidemia, right bundle branch block, essential hypertension Comes today to my for follow-up.  Overall doing well.  Denies have any chest pain tightness squeezing pressure burning chest no dizziness no passing out.  Overall she is doing well.  She also lost some weight and she is very happy with that.  Past Medical History:  Diagnosis Date  . ACS (acute coronary syndrome) (HCC) 12/28/2016  . Arthritis    osteoarthritis knees  . CAD (coronary artery disease)    a. s/p CABG in 1998;  b. s/p prior LAD stenting;  c. 12/2016 Cath: LM 20/50, LAD 50p, diffuse 41m ISR, 80d, 90apical, D1 100, LCX min irregs, OM3 30/40, RCA 40p, 37m, 40d, RPDA 50. EF 40-45%, mid antlat HK, focal apical aneurysmal segment.  . Fibromyalgia   . Hyperlipidemia   . Hyperlipidemia LDL goal <70 07/30/2019  . Hypertension   . Ischemic cardiomyopathy    a. 12/2016 LV Gram: EF 40-45% w/ moderate mid anterolateral HK and a focal apical aneurysmal segment.  . NSTEMI (non-ST elevated myocardial infarction) (HCC) 07/30/2019  . Obesity   . RBBB   . Status post coronary artery bypass graft 08/28/2019  . STEMI (ST elevation myocardial infarction) (HCC) 12/28/2016    Past  Surgical History:  Procedure Laterality Date  . APPENDECTOMY    . BACK SURGERY     ruptured disc  . CORONARY ARTERY BYPASS GRAFT    . CORONARY STENT INTERVENTION N/A 07/31/2019   Procedure: CORONARY STENT INTERVENTION;  Surgeon: Swaziland, Peter M, MD;  Location: [facility] INVASIVE CV LAB;  Service: Cardiovascular;  Laterality: N/A;  . EYE SURGERY     cataracts  . LEFT HEART CATH AND CORONARY ANGIOGRAPHY N/A 12/28/2016   Procedure: LEFT HEART CATH AND CORONARY ANGIOGRAPHY;  Surgeon: Lennette Bihari, MD;  Location: [facility] INVASIVE CV LAB;  Service: Cardiovascular;  Laterality: N/A;  . LEFT HEART CATH AND CORS/GRAFTS ANGIOGRAPHY N/A 07/31/2019   Procedure: LEFT HEART CATH AND CORS/GRAFTS ANGIOGRAPHY;  Surgeon: Swaziland, Peter M, MD;  Location: [facility] INVASIVE CV LAB;  Service: Cardiovascular;  Laterality: N/A;    Current Medications: Current Meds  Medication Sig  . acetaminophen (TYLENOL) 500 MG tablet Take 500-1,000 mg by mouth every 6 (six) hours as needed (for headaches or pain).   Marland Kitchen aspirin 81 MG chewable tablet Chew 1 tablet (81 mg total) by mouth daily.  Marland Kitchen atorvastatin (LIPITOR) 80 MG tablet Take 1 tablet (80 mg total) by mouth daily at 6 PM.  . clopidogrel (PLAVIX) 75 MG tablet Take 1 tablet (75 mg total) by mouth daily.  . isosorbide mononitrate (IMDUR) 60 MG 24 hr tablet Take 1 tablet (60 mg total) by mouth daily.  Marland Kitchen losartan (  COZAAR) 25 MG tablet Take 1 tablet (25 mg total) by mouth daily.  . metoprolol succinate (TOPROL-XL) 50 MG 24 hr tablet Take 1 tablet (50 mg total) by mouth daily.  . nitroGLYCERIN (NITROSTAT) 0.4 MG SL tablet Place 1 tablet (0.4 mg total) under the tongue every 5 (five) minutes as needed for chest pain.  . pantoprazole (PROTONIX) 40 MG tablet Take 1 tablet (40 mg total) by mouth daily.     Allergies:   Phenergan [promethazine] and Sulfa antibiotics   Social History   Socioeconomic History  . Marital status: Widowed    Spouse name: Not on file  . Number of children: Not on  file  . Years of education: Not on file  . Highest education level: Not on file  Occupational History  . Not on file  Tobacco Use  . Smoking status: Former Smoker    Quit date: 12/28/1996    Years since quitting: 23.1  . Smokeless tobacco: Former Clinical biochemist  . Vaping Use: Never used  Substance and Sexual Activity  . Alcohol use: No  . Drug use: No  . Sexual activity: Not Currently  Other Topics Concern  . Not on file  Social History Narrative  . Not on file   Social Determinants of Health   Financial Resource Strain:   . Difficulty of Paying Living Expenses: Not on file  Food Insecurity:   . Worried About Programme researcher, broadcasting/film/video in the Last Year: Not on file  . Ran Out of Food in the Last Year: Not on file  Transportation Needs:   . Lack of Transportation (Medical): Not on file  . Lack of Transportation (Non-Medical): Not on file  Physical Activity:   . Days of Exercise per Week: Not on file  . Minutes of Exercise per Session: Not on file  Stress:   . Feeling of Stress : Not on file  Social Connections:   . Frequency of Communication with Friends and Family: Not on file  . Frequency of Social Gatherings with Friends and Family: Not on file  . Attends Religious Services: Not on file  . Active Member of Clubs or Organizations: Not on file  . Attends Banker Meetings: Not on file  . Marital Status: Not on file     Family History: The patient's family history is not on file. ROS:   Please see the history of present illness.    All 14 point review of systems negative except as described per history of present illness  EKGs/Labs/Other Studies Reviewed:      Recent Labs: 07/31/2019: ALT 20 08/01/2019: Hemoglobin 12.9; Platelets 224 09/25/2019: BUN 6; Creatinine, Ser 0.64; Potassium 3.4; Sodium 142  Recent Lipid Panel    Component Value Date/Time   CHOL 114 09/17/2019 0912   TRIG 140 09/17/2019 0912   HDL 38 (L) 09/17/2019 0912   CHOLHDL 3.0  09/17/2019 0912   CHOLHDL 6.1 07/31/2019 0012   VLDL 30 07/31/2019 0012   LDLCALC 52 09/17/2019 0912    Physical Exam:    VS:  BP 106/82 (BP Location: Left Arm, Patient Position: Sitting, Cuff Size: Normal)   Pulse 70   Ht 5\' 5"  (1.651 m)   Wt 183 lb (83 kg)   SpO2 97%   BMI 30.45 kg/m     Wt Readings from Last 3 Encounters:  02/25/20 183 lb (83 kg)  09/25/19 196 lb (88.9 kg)  08/28/19 191 lb (86.6 kg)     GEN:  Well nourished, well developed in no acute distress HEENT: Normal NECK: No JVD; No carotid bruits LYMPHATICS: No lymphadenopathy CARDIAC: RRR, no murmurs, no rubs, no gallops RESPIRATORY:  Clear to auscultation without rales, wheezing or rhonchi  ABDOMEN: Soft, non-tender, non-distended MUSCULOSKELETAL:  No edema; No deformity  SKIN: Warm and dry LOWER EXTREMITIES: no swelling NEUROLOGIC:  Alert and oriented x 3 PSYCHIATRIC:  Normal affect   ASSESSMENT:    1. Coronary artery disease involving coronary bypass graft of native heart without angina pectoris   2. Ischemic cardiomyopathy   3. Status post coronary artery bypass graft   4. Hyperlipidemia LDL goal <70    PLAN:    In order of problems listed above:  1. Coronary disease: Stable from that point review on dual antiplatelet therapy which will continue until March of next year. 2. Ischemic cardiomyopathy she is on small dose of beta-blocker as well as small dose of ARB, will check Chem-7 today to see if there is any room for increasing the dose of ARB.  And then later will recheck her left ventricle ejection fraction.  The biggest difficulty we have is her low blood pressure. 3. Status post coronary bypass graft.  Stable. 4. Dyslipidemia: She is on high intensity statin: We'll schedule her to have fasting lipid profile done today.  I did review her K PN from April which showed LDL of 52 HDL 38.   Medication Adjustments/Labs and Tests Ordered: Current medicines are reviewed at length with the patient  today.  Concerns regarding medicines are outlined above.  No orders of the defined types were placed in this encounter.  Medication changes: No orders of the defined types were placed in this encounter.   Signed, Georgeanna Lea, MD, [facility] 02/25/2020 9:17 AM    [facility]

## 2020-02-25 NOTE — Addendum Note (Signed)
Addended by: Hazle Quant on: 02/25/2020 09:41 AM   Modules accepted: Orders

## 2020-02-25 NOTE — Patient Instructions (Signed)
Medication Instructions:  Your physician recommends that you continue on your current medications as directed. Please refer to the Current Medication list given to you today.  *If you need a refill on your cardiac medications before your next appointment, please call your pharmacy*   Lab Work: Your physician recommends that you return for lab work today: bmp, lipid  If you have labs (blood work) drawn today and your tests are completely normal, you will receive your results only by: . MyChart Message (if you have MyChart) OR . A paper copy in the mail If you have any lab test that is abnormal or we need to change your treatment, we will call you to review the results.   Testing/Procedures: None   Follow-Up: At [facility], you and your health needs are our priority.  As part of our continuing mission to provide you with exceptional heart care, we have created designated Provider Care Teams.  These Care Teams include your primary Cardiologist (physician) and Advanced Practice Providers (APPs -  Physician Assistants and Nurse Practitioners) who all work together to provide you with the care you need, when you need it.  We recommend signing up for the patient portal called "MyChart".  Sign up information is provided on this After Visit Summary.  MyChart is used to connect with patients for Virtual Visits (Telemedicine).  Patients are able to view lab/test results, encounter notes, upcoming appointments, etc.  Non-urgent messages can be sent to your provider as well.   To learn more about what you can do with MyChart, go to [email or web address].    Your next appointment:   5 month(s)  The format for your next appointment:   In Person  Provider:   Robert Krasowski, MD   Other Instructions   

## 2020-02-26 LAB — BASIC METABOLIC PANEL
BUN/Creatinine Ratio: 11 — ABNORMAL LOW (ref 12–28)
BUN: 7 mg/dL — ABNORMAL LOW (ref 8–27)
CO2: 26 mmol/L (ref 20–29)
Calcium: 9.2 mg/dL (ref 8.7–10.3)
Chloride: 104 mmol/L (ref 96–106)
Creatinine, Ser: 0.65 mg/dL (ref 0.57–1.00)
GFR calc Af Amer: 108 mL/min/{1.73_m2} (ref >59)
GFR calc non Af Amer: 93 mL/min/{1.73_m2} (ref >59)
Glucose: 92 mg/dL (ref 65–99)
Potassium: 3.8 mmol/L (ref 3.5–5.2)
Sodium: 142 mmol/L (ref 134–144)

## 2020-02-26 LAB — LIPID PANEL
Chol/HDL Ratio: 2.9 ratio (ref 0.0–4.4)
Cholesterol, Total: 126 mg/dL (ref 100–199)
HDL: 44 mg/dL (ref >39)
LDL Chol Calc (NIH): 58 mg/dL (ref 0–99)
Triglycerides: 137 mg/dL (ref 0–149)
VLDL Cholesterol Cal: 24 mg/dL (ref 5–40)

## 2020-04-11 ENCOUNTER — Other Ambulatory Visit: Payer: Self-pay

## 2020-04-11 MED ORDER — LOSARTAN POTASSIUM 25 MG PO TABS: 25.0000 mg | ORAL_TABLET | Freq: Every day | ORAL | 3 refills | Status: DC

## 2020-07-26 ENCOUNTER — Encounter: Payer: Self-pay | Admitting: Cardiology

## 2020-07-26 ENCOUNTER — Ambulatory Visit: Payer: Medicare HMO | Admitting: Cardiology

## 2020-07-26 ENCOUNTER — Other Ambulatory Visit: Payer: Self-pay

## 2020-07-26 VITALS — BP 128/80 | HR 65 | Ht 64.5 in | Wt 186.0 lb

## 2020-07-26 DIAGNOSIS — Z951 Presence of aortocoronary bypass graft: Secondary | ICD-10-CM | POA: Diagnosis not present

## 2020-07-26 DIAGNOSIS — E785 Hyperlipidemia, unspecified: Secondary | ICD-10-CM

## 2020-07-26 DIAGNOSIS — I255 Ischemic cardiomyopathy: Secondary | ICD-10-CM | POA: Diagnosis not present

## 2020-07-26 DIAGNOSIS — I10 Essential (primary) hypertension: Secondary | ICD-10-CM

## 2020-07-26 NOTE — Progress Notes (Unsigned)
Cardiology Office Note:    Date:  07/26/2020   ID:  Sharon Saunders, DOB [birth year], MRN [id number]  PCP:  Nonnie Done., MD  Cardiologist:  Gypsy Balsam, MD    Referring MD: Nonnie Done., MD   Chief Complaint  Patient presents with  . Follow-up  I am doing very well  History of Present Illness:    Sharon Saunders is a 65 y.o. female    with complex past medical history. That include coronary artery disease, status post coronary artery bypass graft done in 1998. Also recently she ended up coming to [facility] when cardiac catheterization was done and stent was placed into that her circumflex artery. She does have mildly diminished left ventricle ejection fraction with ejection fraction 40 to 45% based on echocardiogram from March 2021, obesity, dyslipidemia, right bundle branch block, essential hypertension She comes today to my office for follow-up.  Overall doing well.  Denies of any chest pain tightness squeezing pressure burning chest.  When she presented last year with acute coronary syndrome she clearly got episode of chest pain she does not have it anymore she does have long-term mild floor below her main floor at home she has to walk back and forth many times she has no difficulty doing it.  Past Medical History:  Diagnosis Date  . ACS (acute coronary syndrome) (HCC) 12/28/2016  . Arthritis    osteoarthritis knees  . CAD (coronary artery disease)    a. s/p CABG in 1998;  b. s/p prior LAD stenting;  c. 12/2016 Cath: LM 20/50, LAD 50p, diffuse 36m ISR, 80d, 90apical, D1 100, LCX min irregs, OM3 30/40, RCA 40p, 43m, 40d, RPDA 50. EF 40-45%, mid antlat HK, focal apical aneurysmal segment.  . Fibromyalgia   . Hyperlipidemia   . Hyperlipidemia LDL goal <70 07/30/2019  . Hypertension   . Ischemic cardiomyopathy    a. 12/2016 LV Gram: EF 40-45% w/ moderate mid anterolateral HK and a focal apical aneurysmal segment.  . NSTEMI (non-ST elevated myocardial infarction)  (HCC) 07/30/2019  . Obesity   . RBBB   . Status post coronary artery bypass graft 08/28/2019  . STEMI (ST elevation myocardial infarction) (HCC) 12/28/2016    Past Surgical History:  Procedure Laterality Date  . APPENDECTOMY    . BACK SURGERY     ruptured disc  . CORONARY ARTERY BYPASS GRAFT    . CORONARY STENT INTERVENTION N/A 07/31/2019   Procedure: CORONARY STENT INTERVENTION;  Surgeon: Swaziland, Peter M, MD;  Location: [facility] INVASIVE CV LAB;  Service: Cardiovascular;  Laterality: N/A;  . EYE SURGERY     cataracts  . LEFT HEART CATH AND CORONARY ANGIOGRAPHY N/A 12/28/2016   Procedure: LEFT HEART CATH AND CORONARY ANGIOGRAPHY;  Surgeon: Lennette Bihari, MD;  Location: [facility] INVASIVE CV LAB;  Service: Cardiovascular;  Laterality: N/A;  . LEFT HEART CATH AND CORS/GRAFTS ANGIOGRAPHY N/A 07/31/2019   Procedure: LEFT HEART CATH AND CORS/GRAFTS ANGIOGRAPHY;  Surgeon: Swaziland, Peter M, MD;  Location: [facility] INVASIVE CV LAB;  Service: Cardiovascular;  Laterality: N/A;    Current Medications: Current Meds  Medication Sig  . acetaminophen (TYLENOL) 500 MG tablet Take 500-1,000 mg by mouth every 6 (six) hours as needed (for headaches or pain).   Marland Kitchen aspirin 81 MG chewable tablet Chew 1 tablet (81 mg total) by mouth daily.  Marland Kitchen atorvastatin (LIPITOR) 80 MG tablet Take 1 tablet (80 mg total) by mouth daily at 6 PM.  . clopidogrel (PLAVIX) 75 MG  tablet Take 1 tablet (75 mg total) by mouth daily.  . isosorbide mononitrate (IMDUR) 60 MG 24 hr tablet Take 1 tablet (60 mg total) by mouth daily.  Marland Kitchen losartan (COZAAR) 25 MG tablet Take 1 tablet (25 mg total) by mouth daily.  . metoprolol succinate (TOPROL-XL) 50 MG 24 hr tablet Take 1 tablet (50 mg total) by mouth daily.  . nitroGLYCERIN (NITROSTAT) 0.4 MG SL tablet Place 1 tablet (0.4 mg total) under the tongue every 5 (five) minutes as needed for chest pain.  . pantoprazole (PROTONIX) 40 MG tablet Take 1 tablet (40 mg total) by mouth daily.     Allergies:   Phenergan  [promethazine] and Sulfa antibiotics   Social History   Socioeconomic History  . Marital status: Widowed    Spouse name: Not on file  . Number of children: Not on file  . Years of education: Not on file  . Highest education level: Not on file  Occupational History  . Not on file  Tobacco Use  . Smoking status: Former Smoker    Quit date: 12/28/1996    Years since quitting: 23.5  . Smokeless tobacco: Former Clinical biochemist  . Vaping Use: Never used  Substance and Sexual Activity  . Alcohol use: No  . Drug use: No  . Sexual activity: Not Currently  Other Topics Concern  . Not on file  Social History Narrative  . Not on file   Social Determinants of Health   Financial Resource Strain: Not on file  Food Insecurity: Not on file  Transportation Needs: Not on file  Physical Activity: Not on file  Stress: Not on file  Social Connections: Not on file     Family History: The patient's family history is not on file. ROS:   Please see the history of present illness.    All 14 point review of systems negative except as described per history of present illness  EKGs/Labs/Other Studies Reviewed:      Recent Labs: 07/31/2019: ALT 20 08/01/2019: Hemoglobin 12.9; Platelets 224 02/25/2020: BUN 7; Creatinine, Ser 0.65; Potassium 3.8; Sodium 142  Recent Lipid Panel    Component Value Date/Time   CHOL 126 02/25/2020 0000   TRIG 137 02/25/2020 0000   HDL 44 02/25/2020 0000   CHOLHDL 2.9 02/25/2020 0000   CHOLHDL 6.1 07/31/2019 0012   VLDL 30 07/31/2019 0012   LDLCALC 58 02/25/2020 0000    Physical Exam:    VS:  BP 128/80 (BP Location: Left Arm, Patient Position: Sitting)   Pulse 65   Ht 5' 4.5" (1.638 m)   Wt 186 lb (84.4 kg)   SpO2 94%   BMI 31.43 kg/m     Wt Readings from Last 3 Encounters:  07/26/20 186 lb (84.4 kg)  02/25/20 183 lb (83 kg)  09/25/19 196 lb (88.9 kg)     GEN:  Well nourished, well developed in no acute distress HEENT: Normal NECK: No JVD; No  carotid bruits LYMPHATICS: No lymphadenopathy CARDIAC: RRR, no murmurs, no rubs, no gallops RESPIRATORY:  Clear to auscultation without rales, wheezing or rhonchi  ABDOMEN: Soft, non-tender, non-distended MUSCULOSKELETAL:  No edema; No deformity  SKIN: Warm and dry LOWER EXTREMITIES: no swelling NEUROLOGIC:  Alert and oriented x 3 PSYCHIATRIC:  Normal affect   ASSESSMENT:    1. Primary hypertension   2. Ischemic cardiomyopathy   3. Status post coronary artery bypass graft   4. Hyperlipidemia LDL goal <70    PLAN:  In order of problems listed above:  1. Essential hypertension, blood pressure seems to be well controlled we will continue present management. 2. Dyslipidemia I did review K PN which show me her LDL of 58 HDL 44 this is from 02/25/2020.  Will be do fasting lipid profile today make sure she is stable. 3. Coronary artery disease status post PTCA and stenting done exactly a year ago.  She is being on dual antiplatelet therapy for a year will discontinue clopidogrel/Plavix will continue with aspirin. 4. Status post coronary bypass graft.  Noted. 5. We did talk about healthy lifestyle need to exercise on the regular basis be active and proper diet.   Medication Adjustments/Labs and Tests Ordered: Current medicines are reviewed at length with the patient today.  Concerns regarding medicines are outlined above.  No orders of the defined types were placed in this encounter.  Medication changes: No orders of the defined types were placed in this encounter.   Signed, Georgeanna Lea, MD, [facility] 07/26/2020 10:48 AM    [facility]

## 2020-07-26 NOTE — Patient Instructions (Signed)
Medication Instructions:  Your physician has recommended you make the following change in your medication:   STOP: Plavix   *If you need a refill on your cardiac medications before your next appointment, please call your pharmacy*   Lab Work: Your physician recommends that you return for lab work today: lipid  If you have labs (blood work) drawn today and your tests are completely normal, you will receive your results only by: Marland Kitchen MyChart Message (if you have MyChart) OR . A paper copy in the mail If you have any lab test that is abnormal or we need to change your treatment, we will call you to review the results.   Testing/Procedures: None   Follow-Up: At [facility], you and your health needs are our priority.  As part of our continuing mission to provide you with exceptional heart care, we have created designated Provider Care Teams.  These Care Teams include your primary Cardiologist (physician) and Advanced Practice Providers (APPs -  Physician Assistants and Nurse Practitioners) who all work together to provide you with the care you need, when you need it.  We recommend signing up for the patient portal called "MyChart".  Sign up information is provided on this After Visit Summary.  MyChart is used to connect with patients for Virtual Visits (Telemedicine).  Patients are able to view lab/test results, encounter notes, upcoming appointments, etc.  Non-urgent messages can be sent to your provider as well.   To learn more about what you can do with MyChart, go to [email or web address].    Your next appointment:   6 month(s)  The format for your next appointment:   In Person  Provider:   Gypsy Balsam, MD   Other Instructions

## 2020-07-28 ENCOUNTER — Other Ambulatory Visit: Payer: Self-pay

## 2020-07-28 LAB — LIPID PANEL
Chol/HDL Ratio: 3.1 ratio (ref 0.0–4.4)
Cholesterol, Total: 141 mg/dL (ref 100–199)
HDL: 46 mg/dL (ref >39)
LDL Chol Calc (NIH): 72 mg/dL (ref 0–99)
Triglycerides: 127 mg/dL (ref 0–149)
VLDL Cholesterol Cal: 23 mg/dL (ref 5–40)

## 2020-07-28 MED ORDER — ATORVASTATIN CALCIUM 80 MG PO TABS: 80.0000 mg | ORAL_TABLET | Freq: Every day | ORAL | 3 refills | Status: DC

## 2020-07-28 MED ORDER — PANTOPRAZOLE SODIUM 40 MG PO TBEC: 40.0000 mg | DELAYED_RELEASE_TABLET | Freq: Every day | ORAL | 3 refills | Status: DC

## 2020-07-28 MED ORDER — NITROGLYCERIN 0.4 MG SL SUBL: 0.4000 mg | SUBLINGUAL_TABLET | SUBLINGUAL | 11 refills | Status: DC | PRN

## 2020-07-28 MED ORDER — ISOSORBIDE MONONITRATE ER 60 MG PO TB24: 60.0000 mg | ORAL_TABLET | Freq: Every day | ORAL | 3 refills | Status: DC

## 2020-07-28 MED ORDER — METOPROLOL SUCCINATE ER 50 MG PO TB24: 50.0000 mg | ORAL_TABLET | Freq: Every day | ORAL | 3 refills | Status: DC

## 2020-07-28 NOTE — Telephone Encounter (Signed)
Refills for Pantoprazole 40 mg , Isosorb Mono ER 60 mg and Metoprolol ER to [facility]

## 2020-07-28 NOTE — Telephone Encounter (Signed)
Atorvastatin 80 mg and Nitroglycerin 0.4 mg refill sent to [facility] in [place].

## 2021-01-26 ENCOUNTER — Ambulatory Visit: Payer: Medicare HMO | Admitting: Cardiology

## 2021-01-26 ENCOUNTER — Other Ambulatory Visit: Payer: Self-pay

## 2021-01-26 ENCOUNTER — Encounter: Payer: Self-pay | Admitting: Cardiology

## 2021-01-26 VITALS — BP 110/58 | HR 69 | Ht 65.5 in | Wt 172.0 lb

## 2021-01-26 DIAGNOSIS — Z951 Presence of aortocoronary bypass graft: Secondary | ICD-10-CM

## 2021-01-26 DIAGNOSIS — I255 Ischemic cardiomyopathy: Secondary | ICD-10-CM

## 2021-01-26 DIAGNOSIS — I10 Essential (primary) hypertension: Secondary | ICD-10-CM | POA: Diagnosis not present

## 2021-01-26 DIAGNOSIS — I4510 Unspecified right bundle-branch block: Secondary | ICD-10-CM

## 2021-01-26 DIAGNOSIS — E785 Hyperlipidemia, unspecified: Secondary | ICD-10-CM | POA: Diagnosis not present

## 2021-01-26 NOTE — Patient Instructions (Signed)

## 2021-01-26 NOTE — Progress Notes (Signed)
Cardiology Office Note:    Date:  01/26/2021   ID:  [name], DOB 12-04-1955, MRN [id number]  PCP:  Nonnie Done., MD  Cardiologist:  Gypsy Balsam, MD    Referring MD: Nonnie Done., MD   Chief Complaint  Patient presents with   Follow-up  I am doing very well  History of Present Illness:    Sharon Saunders is a 65 y.o. female   with complex past medical history.  That include coronary artery disease, status post coronary artery bypass graft done in 1998.  Also recently she ended up coming to [facility] when cardiac catheterization was done and stent was placed into that her circumflex artery.  She does have mildly diminished left ventricle ejection fraction with ejection fraction 40 to 45% based on echocardiogram from March 2021, obesity, dyslipidemia, right bundle branch block, essential hypertension She comes today 2 months of follow-up overall she is doing very well.  She denies have any chest pain tightness squeezing pressure burning chest no palpitations no dizziness.  Her sister recently moved to her and he leaves together.  She also have a grandchild that she is taking care of.  So she is busy but able to manage everything well.  Past Medical History:  Diagnosis Date   ACS (acute coronary syndrome) (HCC) 12/28/2016   Arthritis    osteoarthritis knees   CAD (coronary artery disease)    a. s/p CABG in 1998;  b. s/p prior LAD stenting;  c. 12/2016 Cath: LM 20/50, LAD 50p, diffuse 29m ISR, 80d, 90apical, D1 100, LCX min irregs, OM3 30/40, RCA 40p, 51m, 40d, RPDA 50. EF 40-45%, mid antlat HK, focal apical aneurysmal segment.   Fibromyalgia    Hyperlipidemia    Hyperlipidemia LDL goal <70 07/30/2019   Hypertension    Ischemic cardiomyopathy    a. 12/2016 LV Gram: EF 40-45% w/ moderate mid anterolateral HK and a focal apical aneurysmal segment.   NSTEMI (non-ST elevated myocardial infarction) (HCC) 07/30/2019   Obesity    RBBB    Status post coronary artery  bypass graft 08/28/2019   STEMI (ST elevation myocardial infarction) (HCC) 12/28/2016    Past Surgical History:  Procedure Laterality Date   APPENDECTOMY     BACK SURGERY     ruptured disc   CORONARY ARTERY BYPASS GRAFT     CORONARY STENT INTERVENTION N/A 07/31/2019   Procedure: CORONARY STENT INTERVENTION;  Surgeon: Swaziland, Peter M, MD;  Location: [facility] INVASIVE CV LAB;  Service: Cardiovascular;  Laterality: N/A;   EYE SURGERY     cataracts   LEFT HEART CATH AND CORONARY ANGIOGRAPHY N/A 12/28/2016   Procedure: LEFT HEART CATH AND CORONARY ANGIOGRAPHY;  Surgeon: Lennette Bihari, MD;  Location: [facility] INVASIVE CV LAB;  Service: Cardiovascular;  Laterality: N/A;   LEFT HEART CATH AND CORS/GRAFTS ANGIOGRAPHY N/A 07/31/2019   Procedure: LEFT HEART CATH AND CORS/GRAFTS ANGIOGRAPHY;  Surgeon: Swaziland, Peter M, MD;  Location: [facility] INVASIVE CV LAB;  Service: Cardiovascular;  Laterality: N/A;    Current Medications: Current Meds  Medication Sig   acetaminophen (TYLENOL) 500 MG tablet Take 500-1,000 mg by mouth every 6 (six) hours as needed for mild pain or moderate pain (for headaches or pain).   aspirin 81 MG chewable tablet Chew 1 tablet (81 mg total) by mouth daily.   atorvastatin (LIPITOR) 80 MG tablet Take 1 tablet (80 mg total) by mouth daily at 6 PM.   isosorbide mononitrate (IMDUR) 60 MG 24 hr  tablet Take 1 tablet (60 mg total) by mouth daily.   losartan (COZAAR) 25 MG tablet Take 1 tablet (25 mg total) by mouth daily.   metoprolol succinate (TOPROL-XL) 50 MG 24 hr tablet Take 1 tablet (50 mg total) by mouth daily.   nitroGLYCERIN (NITROSTAT) 0.4 MG SL tablet Place 1 tablet (0.4 mg total) under the tongue every 5 (five) minutes as needed for chest pain.   pantoprazole (PROTONIX) 40 MG tablet Take 1 tablet (40 mg total) by mouth daily.     Allergies:   Phenergan [promethazine] and Sulfa antibiotics   Social History   Socioeconomic History   Marital status: Widowed    Spouse name: Not on file    Number of children: Not on file   Years of education: Not on file   Highest education level: Not on file  Occupational History   Not on file  Tobacco Use   Smoking status: Former    Types: Cigarettes    Quit date: 12/28/1996    Years since quitting: 24.0   Smokeless tobacco: Former  Building services engineer Use: Never used  Substance and Sexual Activity   Alcohol use: No   Drug use: No   Sexual activity: Not Currently  Other Topics Concern   Not on file  Social History Narrative   Not on file   Social Determinants of Health   Financial Resource Strain: Not on file  Food Insecurity: Not on file  Transportation Needs: Not on file  Physical Activity: Not on file  Stress: Not on file  Social Connections: Not on file     Family History: The patient's family history is not on file. ROS:   Please see the history of present illness.    All 14 point review of systems negative except as described per history of present illness  EKGs/Labs/Other Studies Reviewed:      Recent Labs: 02/25/2020: BUN 7; Creatinine, Ser 0.65; Potassium 3.8; Sodium 142  Recent Lipid Panel    Component Value Date/Time   CHOL 141 07/26/2020 1058   TRIG 127 07/26/2020 1058   HDL 46 07/26/2020 1058   CHOLHDL 3.1 07/26/2020 1058   CHOLHDL 6.1 07/31/2019 0012   VLDL 30 07/31/2019 0012   LDLCALC 72 07/26/2020 1058    Physical Exam:    VS:  BP (!) 110/58 (BP Location: Left Arm, Patient Position: Sitting)   Pulse 69   Ht 5' 5.5" (1.664 m)   Wt 172 lb (78 kg)   SpO2 93%   BMI 28.19 kg/m     Wt Readings from Last 3 Encounters:  01/26/21 172 lb (78 kg)  07/26/20 186 lb (84.4 kg)  02/25/20 183 lb (83 kg)     GEN:  Well nourished, well developed in no acute distress HEENT: Normal NECK: No JVD; No carotid bruits LYMPHATICS: No lymphadenopathy CARDIAC: RRR, no murmurs, no rubs, no gallops RESPIRATORY:  Clear to auscultation without rales, wheezing or rhonchi  ABDOMEN: Soft, non-tender,  non-distended MUSCULOSKELETAL:  No edema; No deformity  SKIN: Warm and dry LOWER EXTREMITIES: no swelling NEUROLOGIC:  Alert and oriented x 3 PSYCHIATRIC:  Normal affect   ASSESSMENT:    1. Status post coronary artery bypass graft   2. Hyperlipidemia LDL goal <70   3. Ischemic cardiomyopathy   4. RBBB   5. Primary hypertension    PLAN:    In order of problems listed above:  Status post coronary artery bypass graft doing well from that point review  asymptomatic.    Dyslipidemia I did review her K PN which show me her LDL of 72 HDL 46 this is from March of this year.  We will continue present medications. Right bundle branch block.  Noted, chronic and not an active problem. Essential hypertension: Blood pressure well controlled continue present management   Medication Adjustments/Labs and Tests Ordered: Current medicines are reviewed at length with the patient today.  Concerns regarding medicines are outlined above.  No orders of the defined types were placed in this encounter.  Medication changes: No orders of the defined types were placed in this encounter.   Signed, Georgeanna Lea, MD, [facility] 01/26/2021 1:42 PM    [facility]

## 2021-04-07 ENCOUNTER — Other Ambulatory Visit: Payer: Self-pay | Admitting: Cardiology

## 2021-05-13 IMAGING — DX DG CHEST 2V
2 series · 2 of 2 positions shown · non-contrast
Comparison: January 04, 2014

CLINICAL DATA: Chest pain starting 1 hour ago

EXAM:
CHEST - 2 VIEW

[chest pa]
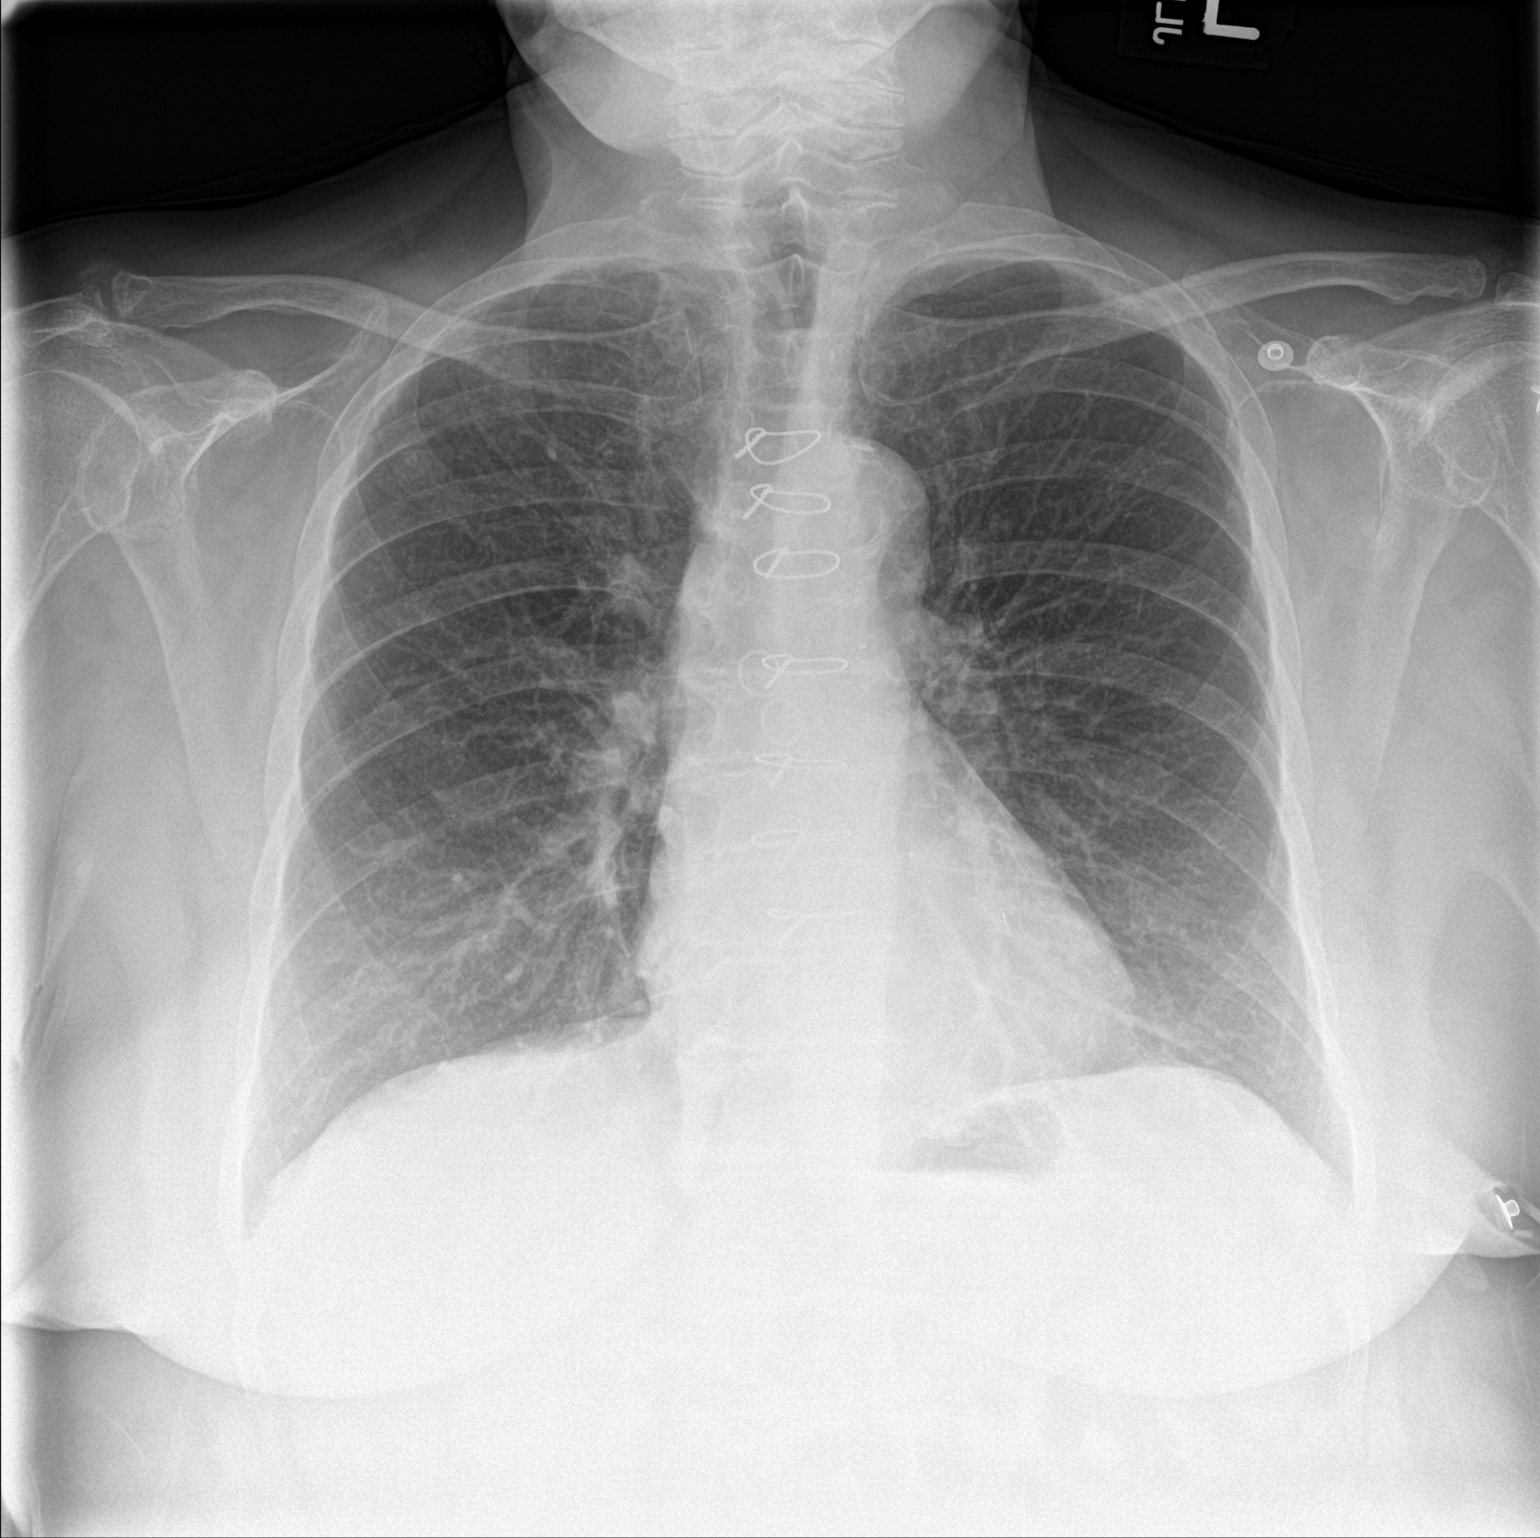

[chest lat]
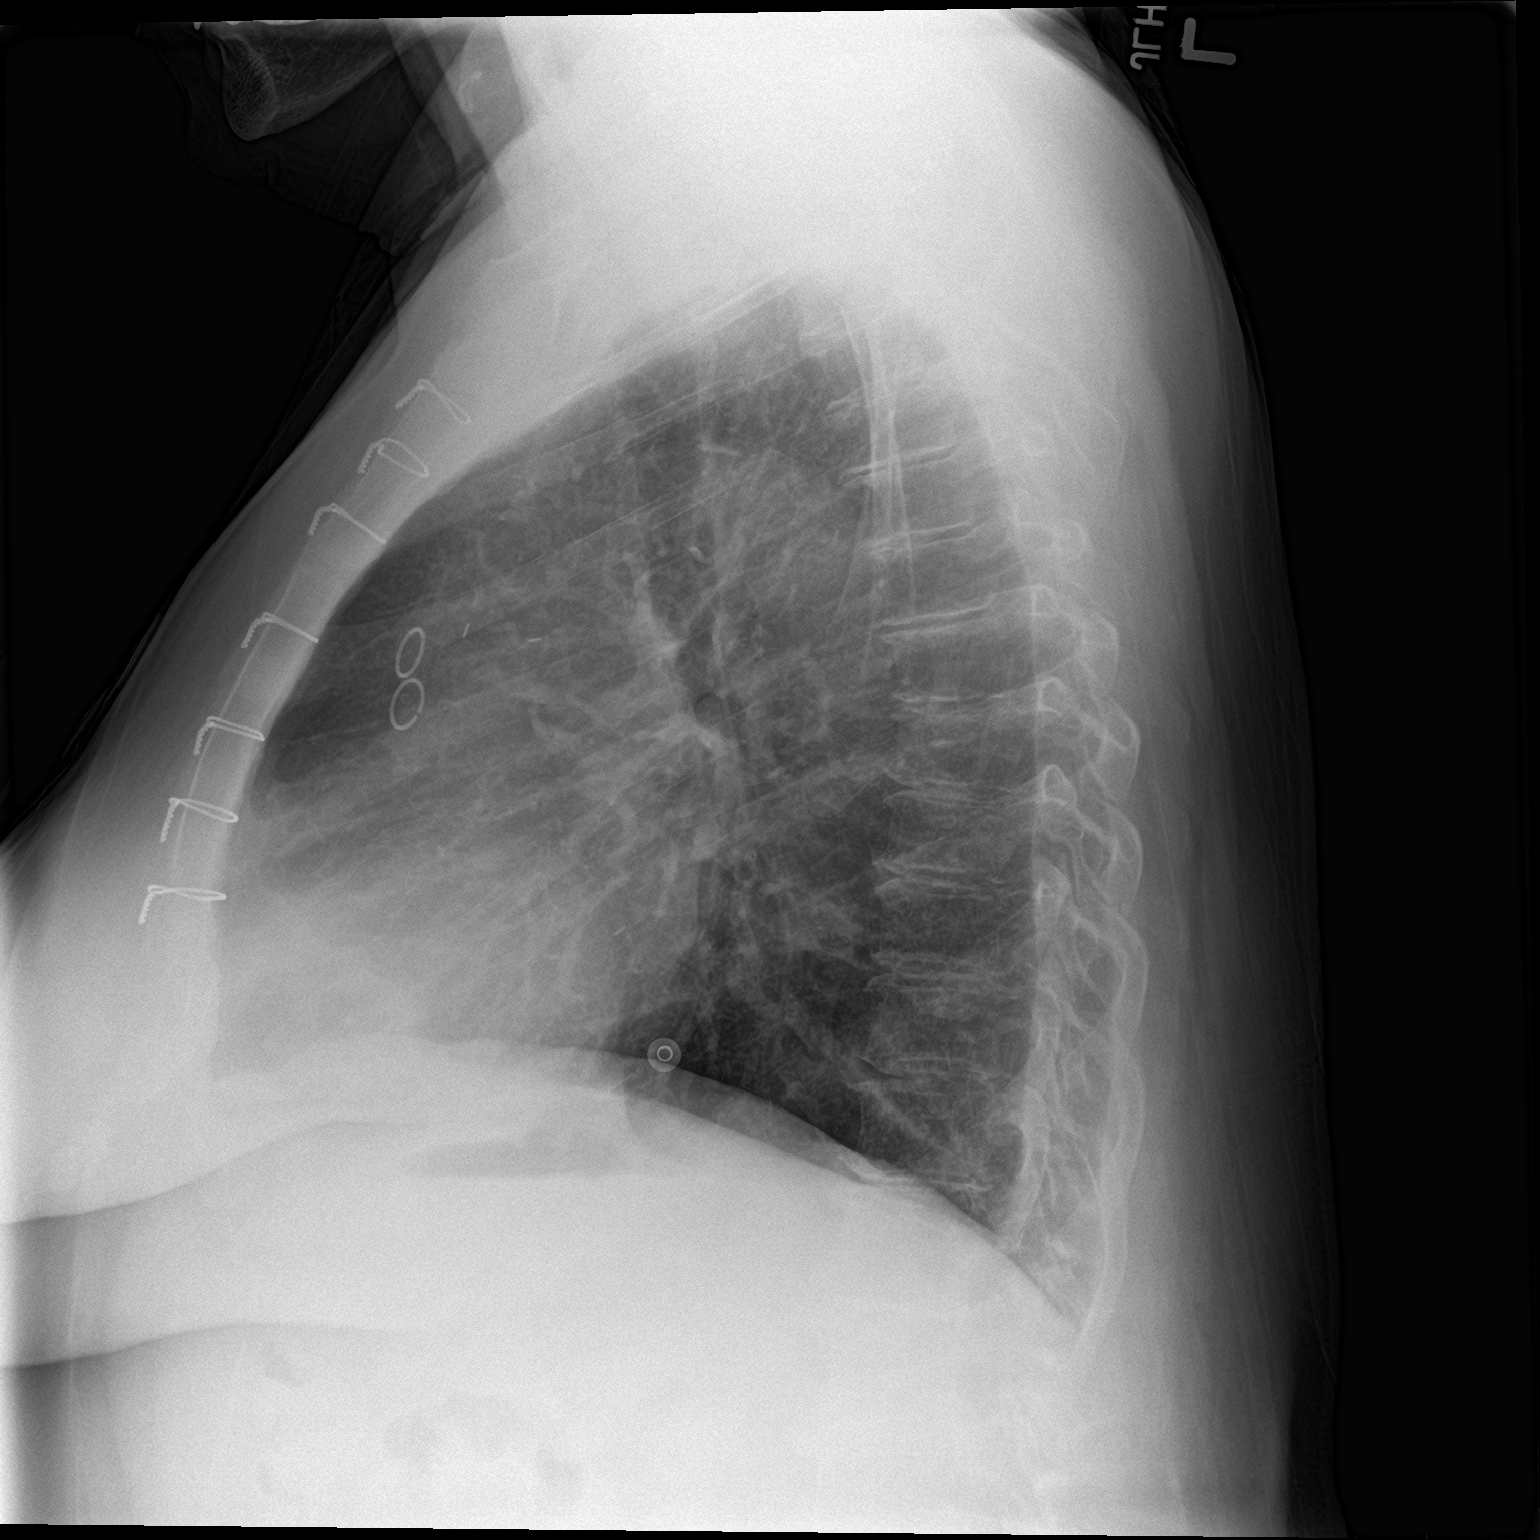

[2 of 2 positions shown; findings below may reference images not displayed]

FINDINGS: The heart size and mediastinal contours are stable. Both lungs are
clear. The visualized skeletal structures are unremarkable.
IMPRESSION: No active cardiopulmonary disease.

## 2021-07-14 DIAGNOSIS — H5203 Hypermetropia, bilateral: Secondary | ICD-10-CM | POA: Diagnosis not present

## 2021-07-22 ENCOUNTER — Other Ambulatory Visit: Payer: Self-pay | Admitting: Cardiology

## 2021-08-01 ENCOUNTER — Ambulatory Visit: Payer: Medicare HMO | Admitting: Cardiology

## 2021-08-01 ENCOUNTER — Other Ambulatory Visit: Payer: Self-pay

## 2021-08-01 ENCOUNTER — Encounter: Payer: Self-pay | Admitting: Cardiology

## 2021-08-01 VITALS — BP 130/80 | HR 65 | Ht 65.5 in | Wt 186.0 lb

## 2021-08-01 DIAGNOSIS — I255 Ischemic cardiomyopathy: Secondary | ICD-10-CM

## 2021-08-01 DIAGNOSIS — I4510 Unspecified right bundle-branch block: Secondary | ICD-10-CM | POA: Diagnosis not present

## 2021-08-01 DIAGNOSIS — E785 Hyperlipidemia, unspecified: Secondary | ICD-10-CM

## 2021-08-01 DIAGNOSIS — I10 Essential (primary) hypertension: Secondary | ICD-10-CM

## 2021-08-01 DIAGNOSIS — Z951 Presence of aortocoronary bypass graft: Secondary | ICD-10-CM

## 2021-08-01 NOTE — Patient Instructions (Signed)
Medication Instructions:  ?Your physician recommends that you continue on your current medications as directed. Please refer to the Current Medication list given to you today. ? ?*If you need a refill on your cardiac medications before your next appointment, please call your pharmacy* ? ? ?Lab Work: ?Your physician recommends that you return for lab work in:  ? ?Labs today: Lipid ? ?If you have labs (blood work) drawn today and your tests are completely normal, you will receive your results only by: ?MyChart Message (if you have MyChart) OR ?A paper copy in the mail ?If you have any lab test that is abnormal or we need to change your treatment, we will call you to review the results. ? ? ?Testing/Procedures: ?Your physician has requested that you have an echocardiogram. Echocardiography is a painless test that uses sound waves to create images of your heart. It provides your doctor with information about the size and shape of your heart and how well your heart?s chambers and valves are working. This procedure takes approximately one hour. There are no restrictions for this procedure. ? ? ? ?Follow-Up: ?At [facility], you and your health needs are our priority.  As part of our continuing mission to provide you with exceptional heart care, we have created designated Provider Care Teams.  These Care Teams include your primary Cardiologist (physician) and Advanced Practice Providers (APPs -  Physician Assistants and Nurse Practitioners) who all work together to provide you with the care you need, when you need it. ? ?We recommend signing up for the patient portal called "MyChart".  Sign up information is provided on this After Visit Summary.  MyChart is used to connect with patients for Virtual Visits (Telemedicine).  Patients are able to view lab/test results, encounter notes, upcoming appointments, etc.  Non-urgent messages can be sent to your provider as well.   ?To learn more about what you can do with MyChart,  go to [email or web address].   ? ?Your next appointment:   ?6 month(s) ? ?The format for your next appointment:   ?In Person ? ?Provider:   ?Gypsy Balsam, MD  ? ? ?Other Instructions ?None  ?

## 2021-08-01 NOTE — Progress Notes (Signed)
?Cardiology Office Note:   ? ?Date:  08/01/2021  ? ?ID:  Sharon Saunders, DOB 08-May-1956, MRN [id number] ? ?PCP:  Nonnie Done., MD  ?Cardiologist:  Gypsy Balsam, MD   ? ?Referring MD: Nonnie Done., MD  ? ?No chief complaint on file. ?I am doing very well ? ?History of Present Illness:   ? ?Sharon Saunders is a 66 y.o. female with past medical history significant for coronary artery disease, in 1998 she got coronary artery bypass graft.  In 2021 she required another intervention this time stent was placed in her circumflex artery.  She also have diminished ejection fraction into the neighborhood 40-45 percent based on echocardiogram from March 2021, obesity, dyslipidemia, right bundle branch block which is chronic, essential hypertension. ?.  She is in my office today for follow-up.  Overall she is doing well.  She denies have any chest pain tightness squeezing pressure mid chest.  She does have mild shortness of breath with exertion. ? ?Past Medical History:  ?Diagnosis Date  ? ACS (acute coronary syndrome) (HCC) 12/28/2016  ? Arthritis   ? osteoarthritis knees  ? CAD (coronary artery disease)   ? a. s/p CABG in 1998;  b. s/p prior LAD stenting;  c. 12/2016 Cath: LM 20/50, LAD 50p, diffuse 5m ISR, 80d, 90apical, D1 100, LCX min irregs, OM3 30/40, RCA 40p, 45m, 40d, RPDA 50. EF 40-45%, mid antlat HK, focal apical aneurysmal segment.  ? Fibromyalgia   ? Hyperlipidemia   ? Hyperlipidemia LDL goal <70 07/30/2019  ? Hypertension   ? Ischemic cardiomyopathy   ? a. 12/2016 LV Gram: EF 40-45% w/ moderate mid anterolateral HK and a focal apical aneurysmal segment.  ? NSTEMI (non-ST elevated myocardial infarction) (HCC) 07/30/2019  ? Obesity   ? RBBB   ? Status post coronary artery bypass graft 08/28/2019  ? STEMI (ST elevation myocardial infarction) (HCC) 12/28/2016  ? ? ?Past Surgical History:  ?Procedure Laterality Date  ? APPENDECTOMY    ? BACK SURGERY    ? ruptured disc  ? CORONARY ARTERY BYPASS GRAFT    ?  CORONARY STENT INTERVENTION N/A 07/31/2019  ? Procedure: CORONARY STENT INTERVENTION;  Surgeon: Swaziland, Peter M, MD;  Location: [facility] INVASIVE CV LAB;  Service: Cardiovascular;  Laterality: N/A;  ? EYE SURGERY    ? cataracts  ? LEFT HEART CATH AND CORONARY ANGIOGRAPHY N/A 12/28/2016  ? Procedure: LEFT HEART CATH AND CORONARY ANGIOGRAPHY;  Surgeon: Lennette Bihari, MD;  Location: [facility] INVASIVE CV LAB;  Service: Cardiovascular;  Laterality: N/A;  ? LEFT HEART CATH AND CORS/GRAFTS ANGIOGRAPHY N/A 07/31/2019  ? Procedure: LEFT HEART CATH AND CORS/GRAFTS ANGIOGRAPHY;  Surgeon: Swaziland, Peter M, MD;  Location: [facility] INVASIVE CV LAB;  Service: Cardiovascular;  Laterality: N/A;  ? ? ?Current Medications: ?Current Meds  ?Medication Sig  ? acetaminophen (TYLENOL) 500 MG tablet Take 500-1,000 mg by mouth every 6 (six) hours as needed for mild pain or moderate pain (for headaches or pain).  ? aspirin 81 MG chewable tablet Chew 1 tablet (81 mg total) by mouth daily.  ? atorvastatin (LIPITOR) 80 MG tablet Take 1 tablet (80 mg total) by mouth daily at 6 PM.  ? isosorbide mononitrate (IMDUR) 60 MG 24 hr tablet Take 1 tablet by mouth once daily  ? losartan (COZAAR) 25 MG tablet Take 1 tablet by mouth once daily  ? metoprolol succinate (TOPROL-XL) 50 MG 24 hr tablet Take 1 tablet by mouth once daily  ? nitroGLYCERIN (NITROSTAT) 0.4  MG SL tablet Place 1 tablet (0.4 mg total) under the tongue every 5 (five) minutes as needed for chest pain.  ? pantoprazole (PROTONIX) 40 MG tablet Take 1 tablet by mouth once daily  ?  ? ?Allergies:   Phenergan [promethazine] and Sulfa antibiotics  ? ?Social History  ? ?Socioeconomic History  ? Marital status: Widowed  ?  Spouse name: Not on file  ? Number of children: Not on file  ? Years of education: Not on file  ? Highest education level: Not on file  ?Occupational History  ? Not on file  ?Tobacco Use  ? Smoking status: Former  ?  Types: Cigarettes  ?  Quit date: 12/28/1996  ?  Years since quitting: 24.6  ?  Smokeless tobacco: Former  ?Vaping Use  ? Vaping Use: Never used  ?Substance and Sexual Activity  ? Alcohol use: No  ? Drug use: No  ? Sexual activity: Not Currently  ?Other Topics Concern  ? Not on file  ?Social History Narrative  ? Not on file  ? ?Social Determinants of Health  ? ?Financial Resource Strain: Not on file  ?Food Insecurity: Not on file  ?Transportation Needs: Not on file  ?Physical Activity: Not on file  ?Stress: Not on file  ?Social Connections: Not on file  ?  ? ?Family History: ?The patient's family history is not on file. ?ROS:   ?Please see the history of present illness.    ?All 14 point review of systems negative except as described per history of present illness ? ?EKGs/Labs/Other Studies Reviewed:   ? ? ? ?Recent Labs: ?No results found for requested labs within last 8760 hours.  ?Recent Lipid Panel ?   ?Component Value Date/Time  ? CHOL 141 07/26/2020 1058  ? TRIG 127 07/26/2020 1058  ? HDL 46 07/26/2020 1058  ? CHOLHDL 3.1 07/26/2020 1058  ? CHOLHDL 6.1 07/31/2019 0012  ? VLDL 30 07/31/2019 0012  ? LDLCALC 72 07/26/2020 1058  ? ? ?Physical Exam:   ? ?VS:  BP 130/80 (BP Location: Left Arm, Patient Position: Sitting, Cuff Size: Normal)   Pulse 65   Ht 5' 5.5" (1.664 m)   Wt 186 lb (84.4 kg)   SpO2 98%   BMI 30.48 kg/m?    ? ?Wt Readings from Last 3 Encounters:  ?08/01/21 186 lb (84.4 kg)  ?01/26/21 172 lb (78 kg)  ?07/26/20 186 lb (84.4 kg)  ?  ? ?GEN:  Well nourished, well developed in no acute distress ?HEENT: Normal ?NECK: No JVD; No carotid bruits ?LYMPHATICS: No lymphadenopathy ?CARDIAC: RRR, no murmurs, no rubs, no gallops ?RESPIRATORY:  Clear to auscultation without rales, wheezing or rhonchi  ?ABDOMEN: Soft, non-tender, non-distended ?MUSCULOSKELETAL:  No edema; No deformity  ?SKIN: Warm and dry ?LOWER EXTREMITIES: no swelling ?NEUROLOGIC:  Alert and oriented x 3 ?PSYCHIATRIC:  Normal affect  ? ?ASSESSMENT:   ? ?1. Status post coronary artery bypass graft   ?2. Hyperlipidemia  LDL goal <70   ?3. Ischemic cardiomyopathy   ?4. RBBB   ?5. Primary hypertension   ? ?PLAN:   ? ?In order of problems listed above: ? ?Status post coronary bypass graft with PTCA and stenting of the circumflex artery done about 2 years ago.  She is on antiplatelet therapy which I will continue she is taking aspirin.  She is also on Imdur as well as beta-blocker which I will continue. ?History of cardiomyopathy with ejection fraction 40 to 45% we will recheck her echocardiogram.  Hemodynamically  stable New York Heart Association class II.  She is on beta-blocker and Cozaar.  We will continue for now will await for results of the echocardiogram to future advanced therapy if needed. ?Right bundle branch block.  Chronic. ?Essential hypertension blood pressure well controlled today continue present management. ?Dyslipidemia I did review K PN have data from last year with LDL 72 HDL 46.  She is on high intense statin form of Lipitor 80 which I will continue.  We will check a fasting lipid profile today. ? ? ?Medication Adjustments/Labs and Tests Ordered: ?Current medicines are reviewed at length with the patient today.  Concerns regarding medicines are outlined above.  ?No orders of the defined types were placed in this encounter. ? ?Medication changes: No orders of the defined types were placed in this encounter. ? ? ?Signed, ?Georgeanna Leaobert J. Dea Bitting, MD, [facility] ?08/01/2021 9:15 AM    ?[facility] ?

## 2021-08-02 ENCOUNTER — Telehealth: Payer: Self-pay

## 2021-08-02 LAB — LIPID PANEL
Chol/HDL Ratio: 3.3 ratio (ref 0.0–4.4)
Cholesterol, Total: 150 mg/dL (ref 100–199)
HDL: 46 mg/dL (ref >39)
LDL Chol Calc (NIH): 76 mg/dL (ref 0–99)
Triglycerides: 166 mg/dL — ABNORMAL HIGH (ref 0–149)
VLDL Cholesterol Cal: 28 mg/dL (ref 5–40)

## 2021-08-02 NOTE — Telephone Encounter (Signed)
Patient notified of results.

## 2021-08-02 NOTE — Telephone Encounter (Signed)
-----   Message from Georgeanna Lea, MD sent at 08/02/2021  9:45 AM EDT ----- ?[place] acceptable, continue present management ?

## 2021-08-04 ENCOUNTER — Ambulatory Visit (INDEPENDENT_AMBULATORY_CARE_PROVIDER_SITE_OTHER): Payer: Medicare HMO

## 2021-08-04 ENCOUNTER — Other Ambulatory Visit: Payer: Self-pay

## 2021-08-04 DIAGNOSIS — E785 Hyperlipidemia, unspecified: Secondary | ICD-10-CM | POA: Diagnosis not present

## 2021-08-04 DIAGNOSIS — I10 Essential (primary) hypertension: Secondary | ICD-10-CM | POA: Diagnosis not present

## 2021-08-04 DIAGNOSIS — I255 Ischemic cardiomyopathy: Secondary | ICD-10-CM | POA: Diagnosis not present

## 2021-08-04 DIAGNOSIS — Z951 Presence of aortocoronary bypass graft: Secondary | ICD-10-CM | POA: Diagnosis not present

## 2021-08-04 DIAGNOSIS — I4510 Unspecified right bundle-branch block: Secondary | ICD-10-CM

## 2021-08-04 LAB — ECHOCARDIOGRAM COMPLETE
Area-P 1/2: 3.37 cm2
S' Lateral: 3.8 cm

## 2021-08-14 ENCOUNTER — Other Ambulatory Visit: Payer: Self-pay | Admitting: Cardiology

## 2021-10-21 ENCOUNTER — Other Ambulatory Visit: Payer: Self-pay | Admitting: Cardiology

## 2021-10-23 NOTE — Telephone Encounter (Signed)
Rx refill sent to pharmacy. 

## 2022-01-17 ENCOUNTER — Other Ambulatory Visit: Payer: Self-pay

## 2022-01-17 MED ORDER — LOSARTAN POTASSIUM 25 MG PO TABS: 25.0000 mg | ORAL_TABLET | Freq: Every day | ORAL | 2 refills | Status: DC

## 2022-01-20 ENCOUNTER — Other Ambulatory Visit: Payer: Self-pay | Admitting: Cardiology

## 2022-01-24 ENCOUNTER — Other Ambulatory Visit: Payer: Self-pay | Admitting: Cardiology

## 2022-01-24 NOTE — Telephone Encounter (Signed)
Rx refill sent to pharmacy. 

## 2022-02-01 ENCOUNTER — Ambulatory Visit: Payer: Medicare HMO | Admitting: Cardiology

## 2022-04-06 ENCOUNTER — Encounter: Payer: Self-pay | Admitting: Cardiology

## 2022-04-06 ENCOUNTER — Ambulatory Visit: Payer: Medicare HMO | Attending: Cardiology | Admitting: Cardiology

## 2022-04-06 VITALS — BP 136/76 | HR 69 | Ht 65.5 in | Wt 187.0 lb

## 2022-04-06 DIAGNOSIS — E785 Hyperlipidemia, unspecified: Secondary | ICD-10-CM

## 2022-04-06 DIAGNOSIS — I255 Ischemic cardiomyopathy: Secondary | ICD-10-CM | POA: Diagnosis not present

## 2022-04-06 DIAGNOSIS — I10 Essential (primary) hypertension: Secondary | ICD-10-CM

## 2022-04-06 DIAGNOSIS — I25810 Atherosclerosis of coronary artery bypass graft(s) without angina pectoris: Secondary | ICD-10-CM

## 2022-04-06 DIAGNOSIS — Z951 Presence of aortocoronary bypass graft: Secondary | ICD-10-CM | POA: Diagnosis not present

## 2022-04-06 DIAGNOSIS — I4510 Unspecified right bundle-branch block: Secondary | ICD-10-CM | POA: Diagnosis not present

## 2022-04-06 NOTE — Patient Instructions (Signed)
Medication Instructions:  Your physician recommends that you continue on your current medications as directed. Please refer to the Current Medication list given to you today.  *If you need a refill on your cardiac medications before your next appointment, please call your pharmacy*   Lab Work: Fasting Lipid panel- today If you have labs (blood work) drawn today and your tests are completely normal, you will receive your results only by: MyChart Message (if you have MyChart) OR A paper copy in the mail If you have any lab test that is abnormal or we need to change your treatment, we will call you to review the results.   Testing/Procedures: None Ordered   Follow-Up: At [facility], you and your health needs are our priority.  As part of our continuing mission to provide you with exceptional heart care, we have created designated Provider Care Teams.  These Care Teams include your primary Cardiologist (physician) and Advanced Practice Providers (APPs -  Physician Assistants and Nurse Practitioners) who all work together to provide you with the care you need, when you need it.  We recommend signing up for the patient portal called "MyChart".  Sign up information is provided on this After Visit Summary.  MyChart is used to connect with patients for Virtual Visits (Telemedicine).  Patients are able to view lab/test results, encounter notes, upcoming appointments, etc.  Non-urgent messages can be sent to your provider as well.   To learn more about what you can do with MyChart, go to [email or web address].    Your next appointment:   6 month(s)  The format for your next appointment:   In Person  Provider:   Gypsy Balsam, MD    Other Instructions NA

## 2022-04-06 NOTE — Addendum Note (Signed)
Addended by: Baldo Ash D on: 04/06/2022 09:08 AM   Modules accepted: Orders

## 2022-04-06 NOTE — Progress Notes (Signed)
Cardiology Office Note:    Date:  04/06/2022   ID:  Sharon Saunders, DOB 1956/01/26, MRN [id number]  PCP:  Nonnie Done., MD  Cardiologist:  Gypsy Balsam, MD    Referring MD: Nonnie Done., MD   Chief Complaint  Patient presents with   Follow-up    History of Present Illness:    Sharon Saunders is a 66 y.o. female  with past medical history significant for coronary artery disease, in 1998 she got coronary artery bypass graft.  In 2021 she required another intervention this time stent was placed in her circumflex artery.  She also have diminished ejection fraction into the neighborhood 40-45 percent based on echocardiogram from March 2021, obesity, dyslipidemia, right bundle branch block which is chronic, essential hypertension.  She comes today to my office for follow-up.  Overall cardiac wise doing well.  She denies have any chest pain tightness squeezing pressure burning chest no palpitations dizziness swelling of lower extremities  Past Medical History:  Diagnosis Date   ACS (acute coronary syndrome) (HCC) 12/28/2016   Arthritis    osteoarthritis knees   CAD (coronary artery disease)    a. s/p CABG in 1998;  b. s/p prior LAD stenting;  c. 12/2016 Cath: LM 20/50, LAD 50p, diffuse 24m ISR, 80d, 90apical, D1 100, LCX min irregs, OM3 30/40, RCA 40p, 55m, 40d, RPDA 50. EF 40-45%, mid antlat HK, focal apical aneurysmal segment.   Fibromyalgia    Hyperlipidemia    Hyperlipidemia LDL goal <70 07/30/2019   Hypertension    Ischemic cardiomyopathy    a. 12/2016 LV Gram: EF 40-45% w/ moderate mid anterolateral HK and a focal apical aneurysmal segment.   NSTEMI (non-ST elevated myocardial infarction) (HCC) 07/30/2019   Obesity    RBBB    Status post coronary artery bypass graft 08/28/2019   STEMI (ST elevation myocardial infarction) (HCC) 12/28/2016    Past Surgical History:  Procedure Laterality Date   APPENDECTOMY     BACK SURGERY     ruptured disc   CORONARY ARTERY  BYPASS GRAFT     CORONARY STENT INTERVENTION N/A 07/31/2019   Procedure: CORONARY STENT INTERVENTION;  Surgeon: Swaziland, Peter M, MD;  Location: [facility] INVASIVE CV LAB;  Service: Cardiovascular;  Laterality: N/A;   EYE SURGERY     cataracts   LEFT HEART CATH AND CORONARY ANGIOGRAPHY N/A 12/28/2016   Procedure: LEFT HEART CATH AND CORONARY ANGIOGRAPHY;  Surgeon: Lennette Bihari, MD;  Location: [facility] INVASIVE CV LAB;  Service: Cardiovascular;  Laterality: N/A;   LEFT HEART CATH AND CORS/GRAFTS ANGIOGRAPHY N/A 07/31/2019   Procedure: LEFT HEART CATH AND CORS/GRAFTS ANGIOGRAPHY;  Surgeon: Swaziland, Peter M, MD;  Location: [facility] INVASIVE CV LAB;  Service: Cardiovascular;  Laterality: N/A;    Current Medications: Current Meds  Medication Sig   acetaminophen (TYLENOL) 500 MG tablet Take 500-1,000 mg by mouth every 6 (six) hours as needed for mild pain or moderate pain (for headaches or pain).   aspirin 81 MG chewable tablet Chew 1 tablet (81 mg total) by mouth daily.   atorvastatin (LIPITOR) 80 MG tablet TAKE 1 TABLET BY MOUTH ONCE DAILY AT  6PM (Patient taking differently: Take 80 mg by mouth daily.)   isosorbide mononitrate (IMDUR) 60 MG 24 hr tablet Take 1 tablet by mouth once daily (Patient taking differently: Take 60 mg by mouth daily.)   losartan (COZAAR) 25 MG tablet Take 1 tablet (25 mg total) by mouth daily.   metoprolol succinate (TOPROL-XL) 50  MG 24 hr tablet Take 1 tablet by mouth once daily (Patient taking differently: Take 50 mg by mouth daily.)   nitroGLYCERIN (NITROSTAT) 0.4 MG SL tablet Place 1 tablet (0.4 mg total) under the tongue every 5 (five) minutes as needed for chest pain.   pantoprazole (PROTONIX) 40 MG tablet Take 1 tablet by mouth once daily (Patient taking differently: Take 40 mg by mouth daily.)     Allergies:   Phenergan [promethazine] and Sulfa antibiotics   Social History   Socioeconomic History   Marital status: Widowed    Spouse name: Not on file   Number of children: Not  on file   Years of education: Not on file   Highest education level: Not on file  Occupational History   Not on file  Tobacco Use   Smoking status: Former    Types: Cigarettes    Quit date: 12/28/1996    Years since quitting: 25.2   Smokeless tobacco: Former  Building services engineer Use: Never used  Substance and Sexual Activity   Alcohol use: No   Drug use: No   Sexual activity: Not Currently  Other Topics Concern   Not on file  Social History Narrative   Not on file   Social Determinants of Health   Financial Resource Strain: Not on file  Food Insecurity: Not on file  Transportation Needs: Not on file  Physical Activity: Not on file  Stress: Not on file  Social Connections: Not on file     Family History: The patient's family history is not on file. ROS:   Please see the history of present illness.    All 14 point review of systems negative except as described per history of present illness  EKGs/Labs/Other Studies Reviewed:      Recent Labs: No results found for requested labs within last 365 days.  Recent Lipid Panel    Component Value Date/Time   CHOL 150 08/01/2021 0932   TRIG 166 (H) 08/01/2021 0932   HDL 46 08/01/2021 0932   CHOLHDL 3.3 08/01/2021 0932   CHOLHDL 6.1 07/31/2019 0012   VLDL 30 07/31/2019 0012   LDLCALC 76 08/01/2021 0932    Physical Exam:    VS:  BP 136/76 (BP Location: Left Arm, Patient Position: Sitting)   Pulse 69   Ht 5' 5.5" (1.664 m)   Wt 187 lb (84.8 kg)   SpO2 96%   BMI 30.65 kg/m     Wt Readings from Last 3 Encounters:  04/06/22 187 lb (84.8 kg)  08/01/21 186 lb (84.4 kg)  01/26/21 172 lb (78 kg)     GEN:  Well nourished, well developed in no acute distress HEENT: Normal NECK: No JVD; No carotid bruits LYMPHATICS: No lymphadenopathy CARDIAC: RRR, no murmurs, no rubs, no gallops RESPIRATORY:  Clear to auscultation without rales, wheezing or rhonchi  ABDOMEN: Soft, non-tender, non-distended MUSCULOSKELETAL:  No  edema; No deformity  SKIN: Warm and dry LOWER EXTREMITIES: no swelling NEUROLOGIC:  Alert and oriented x 3 PSYCHIATRIC:  Normal affect   ASSESSMENT:    1. Coronary artery disease involving coronary bypass graft of native heart without angina pectoris   2. Hyperlipidemia LDL goal <70   3. Status post coronary artery bypass graft   4. Primary hypertension   5. RBBB   6. Ischemic cardiomyopathy    PLAN:    In order of problems listed above:  Coronary disease stable from that point review on appropriate medications she is on antiplatelet  therapy as well as statin which I will continue Dyslipidemia with goal of LDL less than 70, I did review K PN which only her LDL is 76 this is from March of last year.  I will recheck her fasting lipid profile to see if there is any adjustment needed.  If we need to make some augmentation of the therapy addition of Zetia will be appropriate. Status post coronary artery bypass graft stable. Essential hypertension blood pressure well controlled continue present management. Ischemic cardiomyopathy last echocardiogram reviewed showing ejection fraction 5055% which is lower limits of normal, patient is already on on Cozaar as well as beta-blocker which I will continue.   Medication Adjustments/Labs and Tests Ordered: Current medicines are reviewed at length with the patient today.  Concerns regarding medicines are outlined above.  No orders of the defined types were placed in this encounter.  Medication changes: No orders of the defined types were placed in this encounter.   Signed, Georgeanna Lea, MD, [facility] 04/06/2022 8:54 AM    [facility]

## 2022-04-07 LAB — LIPID PANEL
Chol/HDL Ratio: 3.3 ratio (ref 0.0–4.4)
Cholesterol, Total: 117 mg/dL (ref 100–199)
HDL: 36 mg/dL — ABNORMAL LOW (ref >39)
LDL Chol Calc (NIH): 51 mg/dL (ref 0–99)
Triglycerides: 180 mg/dL — ABNORMAL HIGH (ref 0–149)
VLDL Cholesterol Cal: 30 mg/dL (ref 5–40)

## 2022-04-25 ENCOUNTER — Other Ambulatory Visit: Payer: Self-pay | Admitting: Cardiology

## 2022-05-01 ENCOUNTER — Other Ambulatory Visit: Payer: Self-pay | Admitting: Cardiology

## 2022-05-03 DIAGNOSIS — E785 Hyperlipidemia, unspecified: Secondary | ICD-10-CM | POA: Diagnosis not present

## 2022-05-03 DIAGNOSIS — K219 Gastro-esophageal reflux disease without esophagitis: Secondary | ICD-10-CM | POA: Diagnosis not present

## 2022-05-03 DIAGNOSIS — Z825 Family history of asthma and other chronic lower respiratory diseases: Secondary | ICD-10-CM | POA: Diagnosis not present

## 2022-05-03 DIAGNOSIS — Z008 Encounter for other general examination: Secondary | ICD-10-CM | POA: Diagnosis not present

## 2022-05-03 DIAGNOSIS — I2510 Atherosclerotic heart disease of native coronary artery without angina pectoris: Secondary | ICD-10-CM | POA: Diagnosis not present

## 2022-05-03 DIAGNOSIS — Z809 Family history of malignant neoplasm, unspecified: Secondary | ICD-10-CM | POA: Diagnosis not present

## 2022-05-03 DIAGNOSIS — I10 Essential (primary) hypertension: Secondary | ICD-10-CM | POA: Diagnosis not present

## 2022-05-03 DIAGNOSIS — Z87891 Personal history of nicotine dependence: Secondary | ICD-10-CM | POA: Diagnosis not present

## 2022-05-03 DIAGNOSIS — Z6830 Body mass index (BMI) 30.0-30.9, adult: Secondary | ICD-10-CM | POA: Diagnosis not present

## 2022-05-03 DIAGNOSIS — Z8249 Family history of ischemic heart disease and other diseases of the circulatory system: Secondary | ICD-10-CM | POA: Diagnosis not present

## 2022-05-03 DIAGNOSIS — Z882 Allergy status to sulfonamides status: Secondary | ICD-10-CM | POA: Diagnosis not present

## 2022-05-03 DIAGNOSIS — I252 Old myocardial infarction: Secondary | ICD-10-CM | POA: Diagnosis not present

## 2022-05-03 DIAGNOSIS — Z823 Family history of stroke: Secondary | ICD-10-CM | POA: Diagnosis not present

## 2022-06-02 ENCOUNTER — Other Ambulatory Visit: Payer: Self-pay | Admitting: Cardiology

## 2022-06-07 ENCOUNTER — Telehealth: Payer: Self-pay

## 2022-06-07 NOTE — Patient Outreach (Signed)
  Care Coordination   06/07/2022 Name: Sharon Saunders MRN: [id number] DOB: 23-Nov-1955   Care Coordination Outreach Attempts:  An unsuccessful telephone outreach was attempted today to offer the patient information about available care coordination services as a benefit of their health plan.   Follow Up Plan:  Additional outreach attempts will be made to offer the patient care coordination information and services.   Encounter Outcome:  No Answer   Care Coordination Interventions:  No, not indicated    Tomasa Rand, RN, BSN, [facility] [employer] [phone]

## 2022-06-11 ENCOUNTER — Telehealth: Payer: Self-pay

## 2022-06-11 NOTE — Patient Outreach (Signed)
  Care Coordination   06/11/2022 Name: Sharon Saunders MRN: [id number] DOB: [birth year]   Care Coordination Outreach Attempts:  A second unsuccessful outreach was attempted today to offer the patient with information about available care coordination services as a benefit of their health plan.     Follow Up Plan:  Additional outreach attempts will be made to offer the patient care coordination information and services.   Encounter Outcome:  No Answer   Care Coordination Interventions:  No, not indicated    Tomasa Rand, RN, BSN, [facility] [employer] [phone]

## 2022-06-19 ENCOUNTER — Telehealth: Payer: Self-pay

## 2022-06-19 NOTE — Patient Outreach (Signed)
  Care Coordination   06/19/2022 Name: Sharon Saunders MRN: [id number] DOB: [birth year]   Care Coordination Outreach Attempts:  A third unsuccessful outreach was attempted today to offer the patient with information about available care coordination services as a benefit of their health plan.   Follow Up Plan:  No further outreach attempts will be made at this time. We have been unable to contact the patient to offer or enroll patient in care coordination services  Encounter Outcome:  No Answer   Care Coordination Interventions:  No, not indicated    Tomasa Rand, RN, BSN, [facility] [place] Coordinator [phone]

## 2022-10-12 ENCOUNTER — Other Ambulatory Visit: Payer: Self-pay | Admitting: Cardiology

## 2023-01-16 DIAGNOSIS — H109 Unspecified conjunctivitis: Secondary | ICD-10-CM | POA: Diagnosis not present

## 2023-01-17 ENCOUNTER — Other Ambulatory Visit: Payer: Self-pay | Admitting: Cardiology

## 2023-01-23 DIAGNOSIS — H52209 Unspecified astigmatism, unspecified eye: Secondary | ICD-10-CM | POA: Diagnosis not present

## 2023-01-23 DIAGNOSIS — H524 Presbyopia: Secondary | ICD-10-CM | POA: Diagnosis not present

## 2023-01-23 DIAGNOSIS — H5203 Hypermetropia, bilateral: Secondary | ICD-10-CM | POA: Diagnosis not present

## 2023-04-19 ENCOUNTER — Other Ambulatory Visit: Payer: Self-pay | Admitting: Cardiology

## 2023-04-22 ENCOUNTER — Other Ambulatory Visit: Payer: Self-pay | Admitting: Emergency Medicine

## 2023-04-22 MED ORDER — PANTOPRAZOLE SODIUM 40 MG PO TBEC: 40.0000 mg | DELAYED_RELEASE_TABLET | Freq: Every day | ORAL | 0 refills | Status: DC

## 2023-04-24 ENCOUNTER — Other Ambulatory Visit: Payer: Self-pay | Admitting: Cardiology

## 2023-06-05 ENCOUNTER — Other Ambulatory Visit: Payer: Self-pay

## 2023-06-05 ENCOUNTER — Telehealth: Payer: Self-pay | Admitting: Cardiology

## 2023-06-05 MED ORDER — METOPROLOL SUCCINATE ER 50 MG PO TB24: 50.0000 mg | ORAL_TABLET | Freq: Every day | ORAL | 0 refills | Status: DC

## 2023-06-05 MED ORDER — ISOSORBIDE MONONITRATE ER 60 MG PO TB24
60.0000 mg | ORAL_TABLET | Freq: Every day | ORAL | 0 refills | Status: DC
Start: 2023-06-05 — End: 2023-07-01

## 2023-06-05 MED ORDER — ATORVASTATIN CALCIUM 80 MG PO TABS: 80.0000 mg | ORAL_TABLET | Freq: Every day | ORAL | 0 refills | Status: DC

## 2023-06-05 MED ORDER — PANTOPRAZOLE SODIUM 40 MG PO TBEC: 40.0000 mg | DELAYED_RELEASE_TABLET | Freq: Every day | ORAL | 0 refills | Status: DC

## 2023-06-05 MED ORDER — LOSARTAN POTASSIUM 25 MG PO TABS: 25.0000 mg | ORAL_TABLET | Freq: Every day | ORAL | 0 refills | Status: DC

## 2023-06-05 MED ORDER — NITROGLYCERIN 0.4 MG SL SUBL: 0.4000 mg | SUBLINGUAL_TABLET | SUBLINGUAL | 11 refills | Status: AC | PRN

## 2023-06-05 NOTE — Telephone Encounter (Signed)
*  STAT* If patient is at the pharmacy, call can be transferred to refill team.   1. Which medications need to be refilled? (please list name of each medication and dose if known)  metoprolol  succinate (TOPROL -XL) 50 MG 24 hr tablet atorvastatin  (LIPITOR ) 80 MG tablet pantoprazole  (PROTONIX ) 40 MG tablet isosorbide  mononitrate (IMDUR ) 60 MG 24 hr tablet losartan  (COZAAR ) 25 MG tablet  2. Which pharmacy/location (including street and city if local pharmacy) is medication to be sent to? [facility] - [place], [place] - [address]  3. Do they need a 30 day or 90 day supply?   90 day supply  Patient's sister states patient is completely out of medication.

## 2023-06-05 NOTE — Telephone Encounter (Signed)
 Refilled Lipitor  80mg  #30 x 0 ref, Imdur  60mg  #30 ref x 0, Losartan  25mg  #30 ref x 0, Metoprolol  XL 50mg  1 tablet daily #30 ref x 0, Nitroglycerin  #25 ref x 11, Protonix  40mg  #30 ref x 0 sent to [facility] [place] until appt on 06-10-23

## 2023-06-07 NOTE — Progress Notes (Signed)
 Cardiology Office Note:  .   Date:  06/10/2023  ID:  Sharon Saunders, DOB [birth year], MRN [id number] PCP: Sharon Done., MD  [facility] Providers Cardiologist:  Sharon Guadalajara, MD Electrophysiologist:  Sharon Lemming, MD    History of Present Illness: .   Sharon Saunders is a 68 y.o. female with a past medical history of CAD s/p CABG 1998 >> DES to left circumflex 2021, hypertension, RBBB, ischemic cardiomyopathy, fibromyalgia, dyslipidemia.  08/04/2021 echo EF 50 to 55%, grade 1 DD 07/31/2019 cardiac cath severe three-vessel obstructive CAD, 2 vein grafts were noted to be occluded, s/p PCI of the left circumflex with DES x 1 07/31/2019 echo EF 40 to 45%, mild concentric LVH, grade 1 DD, severe hypokinesis of the LV entire lateral wall 12/28/2016 cardiac cath severe multivessel native CAD recommendations for medical therapy 1998 CABG x 4  Most recently evaluated by Dr. Bing Saunders on 04/06/2022, she was well from a cardiac perspective, advised to follow-up in 6 months.  She presents today for follow-up of her CAD.  She has been doing well from a cardiac perspective, she offers no formal complaints.  She has lost approximately 15 pounds since she was last evaluated in our office, she states that her family is worried about her however she feels that her weight loss is okay, says she just does not want the same foods that she used to like. She denies chest pain, palpitations, dyspnea, pnd, orthopnea, n, v, dizziness, syncope, edema, weight gain, or early satiety.     ROS: Review of Systems  Constitutional:  Positive for weight loss.  Musculoskeletal:  Positive for falls (tripped over side walk).  All other systems reviewed and are negative.    Studies Reviewed: .        Cardiac Studies & Procedures   CARDIAC CATHETERIZATION  CARDIAC CATHETERIZATION 07/31/2019  Narrative  Prox RCA lesion is 50% stenosed.  Mid RCA lesion is 50% stenosed.  RPDA lesion is 90%  stenosed.  Ost LM to Mid LM lesion is 50% stenosed.  Mid LAD lesion is 80% stenosed.  Mid LAD to Dist LAD lesion is 90% stenosed.  Prox Cx to Mid Cx lesion is 95% stenosed.  Post intervention, there is a 0% residual stenosis.  A drug-eluting stent was successfully placed using a SYNERGY XD 3.0X24.  LV end diastolic pressure is mildly elevated.  1. Severe 3 vessel obstructive CAD - Diffuse 80% mid LAD at site of prior stent. The mid to distal LAD is diffusely diseased up to 90%. - 95% mid LCx. This is the culprit lesion - Diffuse ectasia of the RCA with 50% stenoses. The PDA is diffusely diseased up to 90% 2. 2 Vein grafts are noted to be occluded at proximal aortic anastomosis. 3. The LIMA was never used as a graft and is a large vessel. 4. Mildly elevated LVEDP 5. Successful PCI of the LCx with DES x 1.  Plan: DAPT for at least a year and probably indefinitely given high disease burden and risk. The LAD and PDA disease is not amenable to intervention. Anticipate DC in am.  Findings Coronary Findings Diagnostic  Dominance: Right  Left Main Ost LM to Mid LM lesion is 50% stenosed.  Left Anterior Descending Mid LAD lesion is 80% stenosed. The lesion was previously treated. Mid LAD to Dist LAD lesion is 90% stenosed.  Left Circumflex Prox Cx to Mid Cx lesion is 95% stenosed.  First Obtuse Marginal Branch Vessel is small in  size.  Right Coronary Artery Vessel was injected. Vessel is large. The vessel is moderately ectatic. Prox RCA lesion is 50% stenosed. Mid RCA lesion is 50% stenosed.  Right Posterior Descending Artery RPDA lesion is 90% stenosed. The lesion is segmental.  Intervention  Prox Cx to Mid Cx lesion Stent CATHETER LAUNCHER 6FR EBU3.5 guide catheter was inserted. Lesion crossed with guidewire using a WIRE ASAHI PROWATER 180CM. Pre-stent angioplasty was performed using a BALLOON SAPPHIRE 2.5X12. A drug-eluting stent was successfully placed using a  SYNERGY XD 3.0X24. Maximum pressure: 16 atm. Post-stent angioplasty was not performed. Post-Intervention Lesion Assessment The intervention was successful. Pre-interventional TIMI flow is 2. Post-intervention TIMI flow is 3. No complications occurred at this lesion. There is a 0% residual stenosis post intervention.   CARDIAC CATHETERIZATION  CARDIAC CATHETERIZATION 12/28/2016  Narrative  LM-2 lesion, 50 %stenosed.  LM-1 lesion, 20 %stenosed.  Ost 1st Diag lesion, 100 %stenosed.  Prox LAD to Mid LAD lesion, 80 %stenosed.  Mid LAD to Dist LAD lesion, 80 %stenosed.  Dist LAD lesion, 90 %stenosed.  3rd Mrg lesion, 40 %stenosed.  Ost 3rd Mrg lesion, 30 %stenosed.  Prox RCA lesion, 40 %stenosed.  Mid RCA lesion, 50 %stenosed.  Dist RCA lesion, 40 %stenosed.  RPDA lesion, 50 %stenosed.  Moderate global LV dysfunction with an ejection fraction of 40-45% with moderate hypocontractility involving the mid distal anterolateral wall and focal aneurysmal apical segment.  Severe multivessel native CAD with 20% smooth distal left main tapering, 20 and 50% very proximal LAD stenoses, with diffuse 80% in-stent restenosis in the mid LAD.  Comment diffuse irregularity and narrowing of the mid distal LAD of 80% which reaches 90% apically and a small caliber; 30 and 50% stenoses in the circumflex marginal vessel; and diffusely irregular dominant RCA with stenoses of 40 1540% in the proximal, mid and mid-distal segment with 50% PDA stenosis.  There is no competitive filling from any graft to any vessel.  Two vein markers are visualized in the aorta.  It is uncertain as to where these grafts had gone and whether or not they were sequential grafts.  Both grafts are occluded at its origin.  The left internal mammary artery was injected but apparently this did not anastomose into any coronary vessel.  RECOMMENDATION: Medical therapy.  I will start patient on aspirin and Plavix.  Patient will be  started on aggressive lipid-lowering therapy.  Nitrates will be added to medical regimen with beta blocker therapy with consideration of possible amlodipine.  We will try to obtain the surgical vascular records which are unavailable in Epic.  Findings Coronary Findings Diagnostic  Dominance: Right  Left Main There is mild distal left main tapering of 20% prior to its bifurcation into the LAD and left circumflex vessel  Left Anterior Descending The LAD has proximal 50% stenoses.  The proximal diagonal vessels are not visualized and may be occluded.  There is diffuse in-stent restenosis in the stent in the mid segment with narrowing of 80%, followed by diffuse irregularity with narrowing of 80%, extending to the apex leading up to 90% diffusely apically.  There is no competitive filling. The lesion was previously treated.  First Diagonal Branch  Left Circumflex The left circumflex has mild irregularity but is without significant stenoses.  There is no competitive filling.  First Obtuse Marginal Branch Vessel is small in size.  Second Obtuse Marginal Branch Vessel is small in size.  Third Obtuse Marginal Branch  Right Coronary Artery The RCA is large caliber  dominant vessel that has diffuse disease with narrowings of 40, 50 and 40% in the proximal, mid and mid-distal segment with 50% PDA stenosis.  There is no competitive filling from any graft.  Right Posterior Descending Artery  Intervention  No interventions have been documented.    ECHOCARDIOGRAM  ECHOCARDIOGRAM COMPLETE 08/04/2021  Narrative ECHOCARDIOGRAM REPORT    Patient Name:   Sharon Saunders Date of Exam: 08/04/2021 Medical Rec #:  [id number]         Height:       65.5 in Accession #:    [id number]        Weight:       186.0 lb Date of Birth:  February 22, 1956         BSA:          1.929 m Patient Age:    66 years          BP:           130/80 mmHg Patient Gender: F                 HR:           70 bpm. Exam  Location:  [facility]  Procedure: 2D Echo, 3D Echo, Cardiac Doppler, Color Doppler and Strain Analysis  Indications:    Status post coronary artery bypass graft [Z95.1 (ICD-10-CM)]; Hyperlipidemia LDL goal <70 [E78.5 (ICD-10-CM)]; Ischemic cardiomyopathy [I25.5 (ICD-10-CM)]; RBBB [I45.10 (ICD-10-CM)]; Primary hypertension [I10 (ICD-10-CM)]  History:        Patient has prior history of Echocardiogram examinations, most recent 07/31/2019. Cardiomyopathy, Previous Myocardial Infarction and CAD; Risk Factors:Hypertension and Dyslipidemia.  Sonographer:    Margreta Journey RDCS Referring Phys: [id number] ROBERT J KRASOWSKI  IMPRESSIONS   1. GLS -14.6. Left ventricular ejection fraction, by estimation, is 50 to 55%. The left ventricle has low normal function. The left ventricle has no regional wall motion abnormalities. Left ventricular diastolic parameters are consistent with Grade I diastolic dysfunction (impaired relaxation). 2. Right ventricular systolic function is normal. The right ventricular size is normal. 3. The mitral valve is normal in structure. No evidence of mitral valve regurgitation. No evidence of mitral stenosis. 4. The aortic valve is normal in structure. Aortic valve regurgitation is not visualized. No aortic stenosis is present. 5. The inferior vena cava is normal in size with greater than 50% respiratory variability, suggesting right atrial pressure of 3 mmHg.  FINDINGS Left Ventricle: GLS -14.6. Left ventricular ejection fraction, by estimation, is 50 to 55%. The left ventricle has low normal function. The left ventricle has no regional wall motion abnormalities. The left ventricular internal cavity size was normal in size. There is no left ventricular hypertrophy. Left ventricular diastolic parameters are consistent with Grade I diastolic dysfunction (impaired relaxation).  Right Ventricle: The right ventricular size is normal. No increase in right ventricular wall  thickness. Right ventricular systolic function is normal.  Left Atrium: Left atrial size was normal in size.  Right Atrium: Right atrial size was normal in size.  Pericardium: There is no evidence of pericardial effusion.  Mitral Valve: The mitral valve is normal in structure. No evidence of mitral valve regurgitation. No evidence of mitral valve stenosis.  Tricuspid Valve: The tricuspid valve is normal in structure. Tricuspid valve regurgitation is not demonstrated. No evidence of tricuspid stenosis.  Aortic Valve: The aortic valve is normal in structure. Aortic valve regurgitation is not visualized. No aortic stenosis is present.  Pulmonic Valve: The pulmonic valve was normal in structure. Pulmonic  valve regurgitation is not visualized. No evidence of pulmonic stenosis.  Aorta: The aortic root is normal in size and structure.  Venous: The inferior vena cava is normal in size with greater than 50% respiratory variability, suggesting right atrial pressure of 3 mmHg.  IAS/Shunts: No atrial level shunt detected by color flow Doppler.   LEFT VENTRICLE PLAX 2D LVIDd:         4.80 cm   Diastology LVIDs:         3.80 cm   LV e' medial:    6.20 cm/s LV PW:         1.00 cm   LV E/e' medial:  8.3 LV IVS:        1.00 cm   LV e' lateral:   9.36 cm/s LVOT diam:     2.00 cm   LV E/e' lateral: 5.5 LV SV:         59 LV SV Index:   31 LVOT Area:     3.14 cm  3D Volume EF: 3D EF:        50 % LV EDV:       112 ml LV ESV:       56 ml LV SV:        56 ml  RIGHT VENTRICLE            IVC RV Basal diam:  2.30 cm    IVC diam: 2.00 cm RV S prime:     5.55 cm/s TAPSE (M-mode): 1.7 cm  LEFT ATRIUM             Index        RIGHT ATRIUM           Index LA diam:        2.90 cm 1.50 cm/m   RA Area:     13.70 cm LA Vol (A2C):   49.1 ml 25.45 ml/m  RA Volume:   28.70 ml  14.88 ml/m LA Vol (A4C):   40.4 ml 20.94 ml/m LA Biplane Vol: 45.8 ml 23.74 ml/m AORTIC VALVE LVOT Vmax:   82.90  cm/s LVOT Vmean:  61.200 cm/s LVOT VTI:    0.189 m  AORTA Ao Root diam: 3.60 cm Ao Asc diam:  3.30 cm  MITRAL VALVE MV Area (PHT): 3.37 cm    SHUNTS MV Decel Time: 225 msec    Systemic VTI:  0.19 m MV E velocity: 51.40 cm/s  Systemic Diam: 2.00 cm MV A velocity: 68.60 cm/s MV E/A ratio:  0.75  Gypsy Balsam MD Electronically signed by Gypsy Balsam MD Signature Date/Time: 08/04/2021/11:17:50 AM    Final             Risk Assessment/Calculations:             Physical Exam:   VS:  BP 110/74 (BP Location: Right Arm, Patient Position: Sitting, Cuff Size: Normal)   Pulse 87   Ht 5' 5.5" (1.664 m)   Wt 162 lb (73.5 kg)   SpO2 97%   BMI 26.55 kg/m    Wt Readings from Last 3 Encounters:  06/10/23 162 lb (73.5 kg)  04/06/22 187 lb (84.8 kg)  08/01/21 186 lb (84.4 kg)    GEN: Well nourished, well developed in no acute distress NECK: No JVD; No carotid bruits CARDIAC: RRR, no murmurs, rubs, gallops RESPIRATORY:  Clear to auscultation without rales, wheezing or rhonchi  ABDOMEN: Soft, non-tender, non-distended EXTREMITIES:  No edema; No deformity   ASSESSMENT AND PLAN: .  CAD - CAD s/p CABG x 4 1998 >> DES to left circumflex 2021.Stable with no anginal symptoms. No indication for ischemic evaluation.  Continue aspirin 81 mg daily, continue Lipitor 80 mg daily, continue Imdur 60 mg daily, continue metoprolol 50 mg daily, continue nitroglycerin as needed--has not needed.   Ischemic cardiomyopathy-most recent echo revealed an EF of 50 to 55%, NYHA class I, euvolemic.  Continue losartan 25 mg daily, continue metoprolol 50 mg daily.  HTN -blood pressure is well-controlled today at 110/74, continue Cozaar 25 mg daily, continue Toprol 50 mg daily.  Unintentional weight loss-she is lost approximately 15 pounds over the last year, initially she states this has not been concerning for her however she then clarifies that she does not know why she just is not hungry like she used  to be, her family is also concerned.  Will check CBC, TSH for any contributory causes.\\  Dyslipidemia - will repeat FLP and direct LDL, prefer < 60.         Dispo: CMET, CBC, TSH, direct LDL, follow-up in 6 months with Dr. Bing Saunders  Signed, Flossie Dibble, NP

## 2023-06-10 ENCOUNTER — Ambulatory Visit: Payer: Medicare HMO | Attending: Cardiology | Admitting: Cardiology

## 2023-06-10 ENCOUNTER — Encounter: Payer: Self-pay | Admitting: Cardiology

## 2023-06-10 VITALS — BP 110/74 | HR 87 | Ht 65.5 in | Wt 162.0 lb

## 2023-06-10 DIAGNOSIS — I25810 Atherosclerosis of coronary artery bypass graft(s) without angina pectoris: Secondary | ICD-10-CM | POA: Diagnosis not present

## 2023-06-10 DIAGNOSIS — I4510 Unspecified right bundle-branch block: Secondary | ICD-10-CM

## 2023-06-10 DIAGNOSIS — I255 Ischemic cardiomyopathy: Secondary | ICD-10-CM | POA: Diagnosis not present

## 2023-06-10 DIAGNOSIS — E785 Hyperlipidemia, unspecified: Secondary | ICD-10-CM | POA: Diagnosis not present

## 2023-06-10 DIAGNOSIS — R634 Abnormal weight loss: Secondary | ICD-10-CM

## 2023-06-10 NOTE — Patient Instructions (Signed)
Medication Instructions:  Your physician recommends that you continue on your current medications as directed. Please refer to the Current Medication list given to you today.  *If you need a refill on your cardiac medications before your next appointment, please call your pharmacy*   Lab Work: Your physician recommends that you return for lab work in: Today for CMP, CBC, TSH and Direct LDL  If you have labs (blood work) drawn today and your tests are completely normal, you will receive your results only by: MyChart Message (if you have MyChart) OR A paper copy in the mail If you have any lab test that is abnormal or we need to change your treatment, we will call you to review the results.   Testing/Procedures: NONE   Follow-Up: At [facility], you and your health needs are our priority.  As part of our continuing mission to provide you with exceptional heart care, we have created designated Provider Care Teams.  These Care Teams include your primary Cardiologist (physician) and Advanced Practice Providers (APPs -  Physician Assistants and Nurse Practitioners) who all work together to provide you with the care you need, when you need it.  We recommend signing up for the patient portal called "MyChart".  Sign up information is provided on this After Visit Summary.  MyChart is used to connect with patients for Virtual Visits (Telemedicine).  Patients are able to view lab/test results, encounter notes, upcoming appointments, etc.  Non-urgent messages can be sent to your provider as well.   To learn more about what you can do with MyChart, go to [email or web address].    Your next appointment:   6 month(s)  Provider:   Gypsy Balsam, MD    Other Instructions

## 2023-06-11 LAB — COMPREHENSIVE METABOLIC PANEL WITH GFR
ALT: 10 IU/L (ref 0–32)
AST: 16 IU/L (ref 0–40)
Albumin: 4.1 g/dL (ref 3.9–4.9)
Alkaline Phosphatase: 96 IU/L (ref 44–121)
BUN/Creatinine Ratio: 10 — ABNORMAL LOW (ref 12–28)
BUN: 8 mg/dL (ref 8–27)
Bilirubin Total: 0.8 mg/dL (ref 0.0–1.2)
CO2: 25 mmol/L (ref 20–29)
Calcium: 9.1 mg/dL (ref 8.7–10.3)
Chloride: 103 mmol/L (ref 96–106)
Creatinine, Ser: 0.84 mg/dL (ref 0.57–1.00)
Globulin, Total: 2.3 g/dL (ref 1.5–4.5)
Glucose: 97 mg/dL (ref 70–99)
Potassium: 4.2 mmol/L (ref 3.5–5.2)
Sodium: 141 mmol/L (ref 134–144)
Total Protein: 6.4 g/dL (ref 6.0–8.5)
eGFR: 76 mL/min/1.73

## 2023-06-11 LAB — CBC WITH DIFFERENTIAL/PLATELET
Basophils Absolute: 0.1 x10E3/uL (ref 0.0–0.2)
Basos: 1 %
EOS (ABSOLUTE): 0.1 x10E3/uL (ref 0.0–0.4)
Eos: 2 %
Hematocrit: 40.7 % (ref 34.0–46.6)
Hemoglobin: 13.2 g/dL (ref 11.1–15.9)
Immature Grans (Abs): 0 x10E3/uL (ref 0.0–0.1)
Immature Granulocytes: 0 %
Lymphocytes Absolute: 2 x10E3/uL (ref 0.7–3.1)
Lymphs: 36 %
MCH: 31.1 pg (ref 26.6–33.0)
MCHC: 32.4 g/dL (ref 31.5–35.7)
MCV: 96 fL (ref 79–97)
Monocytes Absolute: 0.5 x10E3/uL (ref 0.1–0.9)
Monocytes: 8 %
Neutrophils Absolute: 2.8 x10E3/uL (ref 1.4–7.0)
Neutrophils: 53 %
Platelets: 237 x10E3/uL (ref 150–450)
RBC: 4.24 x10E6/uL (ref 3.77–5.28)
RDW: 12.2 % (ref 11.7–15.4)
WBC: 5.4 x10E3/uL (ref 3.4–10.8)

## 2023-06-11 LAB — TSH: TSH: 2.21 u[IU]/mL (ref 0.450–4.500)

## 2023-06-11 LAB — LDL CHOLESTEROL, DIRECT: LDL Direct: 91 mg/dL (ref 0–99)

## 2023-06-14 ENCOUNTER — Encounter: Payer: Self-pay | Admitting: Emergency Medicine

## 2023-07-01 ENCOUNTER — Other Ambulatory Visit: Payer: Self-pay | Admitting: Cardiology

## 2023-07-31 ENCOUNTER — Other Ambulatory Visit: Payer: Self-pay | Admitting: Cardiology

## 2023-08-06 ENCOUNTER — Other Ambulatory Visit: Payer: Self-pay | Admitting: Cardiology

## 2023-08-06 NOTE — Telephone Encounter (Signed)
 Rx refill sent to pharmacy.

## 2023-09-29 ENCOUNTER — Other Ambulatory Visit: Payer: Self-pay | Admitting: Cardiology

## 2023-09-30 NOTE — Telephone Encounter (Signed)
 Rx refill sent to pharmacy.

## 2023-10-03 ENCOUNTER — Other Ambulatory Visit: Payer: Self-pay | Admitting: Cardiology

## 2023-10-04 ENCOUNTER — Encounter (HOSPITAL_COMMUNITY): Payer: Self-pay

## 2023-10-07 ENCOUNTER — Telehealth: Payer: Self-pay | Admitting: Vascular Surgery

## 2023-10-10 ENCOUNTER — Telehealth: Payer: Self-pay | Admitting: Cardiology

## 2023-10-10 NOTE — Telephone Encounter (Signed)
 Spoke with patient and  sister. She said she had a visit at [facility] and had an abnormal CT that needs to be addressed (aneurysm). I schedule the patient to be seen on Wednesday 05/28 per patient request due to siblings schedule. Records in Pod A.

## 2023-10-10 NOTE — Telephone Encounter (Signed)
 Pt's sister calling stating pt was seen in the ED and they want her to see a vasc surgeon due to results of testing-aortic aneurysm. Requesting cb to pt

## 2023-10-15 ENCOUNTER — Telehealth: Payer: Self-pay | Admitting: Cardiology

## 2023-10-15 DIAGNOSIS — E785 Hyperlipidemia, unspecified: Secondary | ICD-10-CM | POA: Diagnosis not present

## 2023-10-15 DIAGNOSIS — I2510 Atherosclerotic heart disease of native coronary artery without angina pectoris: Secondary | ICD-10-CM | POA: Diagnosis not present

## 2023-10-15 DIAGNOSIS — R079 Chest pain, unspecified: Secondary | ICD-10-CM | POA: Diagnosis not present

## 2023-10-15 DIAGNOSIS — I714 Abdominal aortic aneurysm, without rupture, unspecified: Secondary | ICD-10-CM

## 2023-10-15 DIAGNOSIS — I10 Essential (primary) hypertension: Secondary | ICD-10-CM | POA: Diagnosis not present

## 2023-10-15 NOTE — Telephone Encounter (Signed)
 Daughter Colonel Dears) stated patient is being discharged from hospital today (5/27) and will keep her appointment tomorrow (5/28)

## 2023-10-15 NOTE — Telephone Encounter (Signed)
 Called the patient and clarified that she would be here tomorrow for her appointment on 5/28 at 3:00 pm with Dr. Krasowski. Patient verbalized understanding and had no further questions at this time.

## 2023-10-15 NOTE — Telephone Encounter (Signed)
 This patient is currently in the hospital, I spoke with patients sister and she said that she will be having a stress test done today and may still be in the hospital tomorrow. If she's released from the hospital should she still come to her appointment tomorrow? The sister wanted to me send a message back and ask. CB # [phone]

## 2023-10-16 ENCOUNTER — Inpatient Hospital Stay: Admit: 2023-10-16 | Admitting: Student in an Organized Health Care Education/Training Program

## 2023-10-16 ENCOUNTER — Other Ambulatory Visit: Payer: Self-pay

## 2023-10-16 ENCOUNTER — Ambulatory Visit: Admitting: Cardiology

## 2023-10-16 ENCOUNTER — Encounter (HOSPITAL_COMMUNITY): Payer: Self-pay

## 2023-10-16 DIAGNOSIS — I714 Abdominal aortic aneurysm, without rupture, unspecified: Secondary | ICD-10-CM | POA: Insufficient documentation

## 2023-11-07 ENCOUNTER — Ambulatory Visit: Admitting: Cardiology

## 2023-11-07 ENCOUNTER — Ambulatory Visit: Attending: Cardiology | Admitting: Cardiology

## 2023-11-07 ENCOUNTER — Encounter: Payer: Self-pay | Admitting: Cardiology

## 2023-11-07 VITALS — BP 108/66 | HR 66 | Ht 65.5 in | Wt 161.6 lb

## 2023-11-07 DIAGNOSIS — Z951 Presence of aortocoronary bypass graft: Secondary | ICD-10-CM

## 2023-11-07 DIAGNOSIS — R0609 Other forms of dyspnea: Secondary | ICD-10-CM

## 2023-11-07 DIAGNOSIS — I255 Ischemic cardiomyopathy: Secondary | ICD-10-CM | POA: Diagnosis not present

## 2023-11-07 DIAGNOSIS — E782 Mixed hyperlipidemia: Secondary | ICD-10-CM

## 2023-11-07 DIAGNOSIS — I25810 Atherosclerosis of coronary artery bypass graft(s) without angina pectoris: Secondary | ICD-10-CM | POA: Diagnosis not present

## 2023-11-07 DIAGNOSIS — I714 Abdominal aortic aneurysm, without rupture, unspecified: Secondary | ICD-10-CM | POA: Diagnosis not present

## 2023-11-07 NOTE — Addendum Note (Signed)
 Addended by: Shawnee Dellen D on: 11/07/2023 01:05 PM   Modules accepted: Orders

## 2023-11-07 NOTE — Progress Notes (Signed)
 Cardiology Office Note:    Date:  11/07/2023   ID:  Sharon Saunders, DOB 1955/07/12, MRN [id number]  PCP:  Lucius Sabins., MD  Cardiologist:  Ralene Burger, MD    Referring MD: Lucius Sabins., MD   Chief Complaint  Patient presents with   Hospitalization Follow-up    History of Present Illness:    Sharon Saunders is a 68 y.o. female past medical history significant for coronary artery disease status post coronary bypass graft in 1998, also 2021 she required 2 drug-eluting stent to left circumflex artery, hypertension, right bundle branch block, ischemic cardiomyopathy with last left ventricular ejection fraction from 2023 of 50 to 55%, fibromyalgia, dyslipidemia.  Recently she ended up going to the hospital because of atypical chest pain, stress that show very small area of ischemia involving midportion of the anterior wall, however the most worrisome finding is the fact that she was fine to have abdominal to cannabis measuring 61 mm.  She is here to talk about this.  She denies have any chest pain tightness squeezing pressure burning chest anymore she is very careful in doing stuff.  She try not to lift any heavy objects.  Past Medical History:  Diagnosis Date   AAA (abdominal aortic aneurysm) without rupture (HCC)    ACS (acute coronary syndrome) (HCC) 12/28/2016   Arthritis    osteoarthritis knees   CAD (coronary artery disease)    a. s/p CABG in 1998;  b. s/p prior LAD stenting;  c. 12/2016 Cath: LM 20/50, LAD 50p, diffuse 51m ISR, 80d, 90apical, D1 100, LCX min irregs, OM3 30/40, RCA 40p, 69m, 40d, RPDA 50. EF 40-45%, mid antlat HK, focal apical aneurysmal segment.   Fibromyalgia    Hyperlipidemia    Hyperlipidemia LDL goal <70 07/30/2019   Hypertension    Ischemic cardiomyopathy    a. 12/2016 LV Gram: EF 40-45% w/ moderate mid anterolateral HK and a focal apical aneurysmal segment.   NSTEMI (non-ST elevated myocardial infarction) (HCC) 07/30/2019   Obesity    RBBB     Status post coronary artery bypass graft 08/28/2019   STEMI (ST elevation myocardial infarction) (HCC) 12/28/2016    Past Surgical History:  Procedure Laterality Date   APPENDECTOMY     BACK SURGERY     ruptured disc   CORONARY ARTERY BYPASS GRAFT     CORONARY STENT INTERVENTION N/A 07/31/2019   Procedure: CORONARY STENT INTERVENTION;  Surgeon: Swaziland, Peter M, MD;  Location: [facility] INVASIVE CV LAB;  Service: Cardiovascular;  Laterality: N/A;   EYE SURGERY     cataracts   LEFT HEART CATH AND CORONARY ANGIOGRAPHY N/A 12/28/2016   Procedure: LEFT HEART CATH AND CORONARY ANGIOGRAPHY;  Surgeon: Millicent Ally, MD;  Location: [facility] INVASIVE CV LAB;  Service: Cardiovascular;  Laterality: N/A;   LEFT HEART CATH AND CORS/GRAFTS ANGIOGRAPHY N/A 07/31/2019   Procedure: LEFT HEART CATH AND CORS/GRAFTS ANGIOGRAPHY;  Surgeon: Swaziland, Peter M, MD;  Location: [facility] INVASIVE CV LAB;  Service: Cardiovascular;  Laterality: N/A;    Current Medications: Current Meds  Medication Sig   acetaminophen  (TYLENOL ) 500 MG tablet Take 500-1,000 mg by mouth every 6 (six) hours as needed for mild pain or moderate pain (for headaches or pain).   aspirin  81 MG chewable tablet Chew 1 tablet (81 mg total) by mouth daily.   atorvastatin  (LIPITOR ) 80 MG tablet Take 1 tablet by mouth once daily   clopidogrel  (PLAVIX ) 75 MG tablet Take 75 mg by mouth daily.  isosorbide  mononitrate (IMDUR ) 60 MG 24 hr tablet Take 1 tablet (60 mg total) by mouth daily.   losartan  (COZAAR ) 25 MG tablet Take 1 tablet by mouth once daily   metoprolol  succinate (TOPROL -XL) 50 MG 24 hr tablet Take 1 tablet (50 mg total) by mouth daily.   nitroGLYCERIN  (NITROSTAT ) 0.4 MG SL tablet Place 1 tablet (0.4 mg total) under the tongue every 5 (five) minutes as needed for chest pain.   pantoprazole  (PROTONIX ) 40 MG tablet Take 1 tablet (40 mg total) by mouth daily.     Allergies:   Phenergan [promethazine], Quinolones, and Sulfa antibiotics   Social History    Socioeconomic History   Marital status: Widowed    Spouse name: Not on file   Number of children: Not on file   Years of education: Not on file   Highest education level: Not on file  Occupational History   Not on file  Tobacco Use   Smoking status: Former    Current packs/day: 0.00    Types: Cigarettes    Quit date: 12/28/1996    Years since quitting: 26.8   Smokeless tobacco: Former  Building services engineer status: Never Used  Substance and Sexual Activity   Alcohol use: No   Drug use: No   Sexual activity: Not Currently  Other Topics Concern   Not on file  Social History Narrative   Not on file   Social Drivers of Health   Financial Resource Strain: Not on file  Food Insecurity: Low Risk  (10/16/2023)   Received from [facility]   Hunger Vital Sign    Within the past 12 months, you worried that your food would run out before you got money to buy more: Never true    Within the past 12 months, the food you bought just didn't last and you didn't have money to get more. : Never true  Transportation Needs: No Transportation Needs (10/16/2023)   Received from [employer]    In the past 12 months, has lack of reliable transportation kept you from medical appointments, meetings, work or from getting things needed for daily living? : No  Physical Activity: Not on file  Stress: Not on file  Social Connections: Not on file     Family History: The patient's family history is not on file. ROS:   Please see the history of present illness.    All 14 point review of systems negative except as described per history of present illness  EKGs/Labs/Other Studies Reviewed:         Recent Labs: 06/10/2023: ALT 10; BUN 8; Creatinine, Ser 0.84; Hemoglobin 13.2; Platelets 237; Potassium 4.2; Sodium 141; TSH 2.210  Recent Lipid Panel    Component Value Date/Time   CHOL 117 04/06/2022 0904   TRIG 180 (H) 04/06/2022 0904   HDL 36 (L) 04/06/2022 0904   CHOLHDL 3.3  04/06/2022 0904   CHOLHDL 6.1 07/31/2019 0012   VLDL 30 07/31/2019 0012   LDLCALC 51 04/06/2022 0904   LDLDIRECT 91 06/10/2023 1111    Physical Exam:    VS:  BP 108/66 (BP Location: Right Arm, Patient Position: Sitting)   Pulse 66   Ht 5' 5.5 (1.664 m)   Wt 161 lb 9.6 oz (73.3 kg)   SpO2 96%   BMI 26.48 kg/m     Wt Readings from Last 3 Encounters:  11/07/23 161 lb 9.6 oz (73.3 kg)  06/10/23 162 lb (73.5 kg)  04/06/22 187  lb (84.8 kg)     GEN:  Well nourished, well developed in no acute distress HEENT: Normal NECK: No JVD; No carotid bruits LYMPHATICS: No lymphadenopathy CARDIAC: RRR, no murmurs, no rubs, no gallops RESPIRATORY:  Clear to auscultation without rales, wheezing or rhonchi  ABDOMEN: Soft, non-tender, non-distended MUSCULOSKELETAL:  No edema; No deformity  SKIN: Warm and dry LOWER EXTREMITIES: no swelling NEUROLOGIC:  Alert and oriented x 3 PSYCHIATRIC:  Normal affect   ASSESSMENT:    1. Coronary artery disease involving coronary bypass graft of native heart without angina pectoris   2. Abdominal aortic aneurysm (AAA) without rupture, unspecified part (HCC)   3. Ischemic cardiomyopathy   4. Status post coronary artery bypass graft   5. Mixed hyperlipidemia    PLAN:    In order of problems listed above:  Coronary disease stable from that point review of her stress test is low risk ejection fraction was preserved on the stress test.  Asymptomatic on dual platelet therapy. History of cardiomyopathy I will ask her to have echocardiogram repeated to recheck left ventricle ejection fraction. Abdominal arctic aneurysm.  She will be referred to vascular surgeons for evaluation for possible stenting versus open abdominal surgery. Dyslipidemia I did review K PN which show me LDL 51 HDL 36 she is on high intensity statin for of Lipitor  80 which I will continue. Gallstone.  Noted.  She may require cholecystectomy in the future   Medication Adjustments/Labs and  Tests Ordered: Current medicines are reviewed at length with the patient today.  Concerns regarding medicines are outlined above.  Orders Placed This Encounter  Procedures   EKG 12-Lead   Medication changes: No orders of the defined types were placed in this encounter.   Signed, Manfred Seed, MD, [facility] 11/07/2023 12:50 PM    [facility]

## 2023-11-07 NOTE — Addendum Note (Signed)
 Addended by: Shawnee Dellen D on: 11/07/2023 01:09 PM   Modules accepted: Orders

## 2023-11-07 NOTE — Patient Instructions (Addendum)
 Medication Instructions:  Your physician recommends that you continue on your current medications as directed. Please refer to the Current Medication list given to you today.  *If you need a refill on your cardiac medications before your next appointment, please call your pharmacy*   Lab Work: None Ordered If you have labs (blood work) drawn today and your tests are completely normal, you will receive your results only by: MyChart Message (if you have MyChart) OR A paper copy in the mail If you have any lab test that is abnormal or we need to change your treatment, we will call you to review the results.   Testing/Procedures: Your physician has requested that you have an echocardiogram. Echocardiography is a painless test that uses sound waves to create images of your heart. It provides your doctor with information about the size and shape of your heart and how well your heart's chambers and valves are working. This procedure takes approximately one hour. There are no restrictions for this procedure. Please do NOT wear cologne, perfume, aftershave, or lotions (deodorant is allowed). Please arrive 15 minutes prior to your appointment time.  Please note: We ask at that you not bring children with you during ultrasound (echo/ vascular) testing. Due to room size and safety concerns, children are not allowed in the ultrasound rooms during exams. Our front office staff cannot provide observation of children in our lobby area while testing is being conducted. An adult accompanying a patient to their appointment will only be allowed in the ultrasound room at the discretion of the ultrasound technician under special circumstances. We apologize for any inconvenience.    Follow-Up: At [facility], you and your health needs are our priority.  As part of our continuing mission to provide you with exceptional heart care, we have created designated Provider Care Teams.  These Care Teams include your  primary Cardiologist (physician) and Advanced Practice Providers (APPs -  Physician Assistants and Nurse Practitioners) who all work together to provide you with the care you need, when you need it.  We recommend signing up for the patient portal called MyChart.  Sign up information is provided on this After Visit Summary.  MyChart is used to connect with patients for Virtual Visits (Telemedicine).  Patients are able to view lab/test results, encounter notes, upcoming appointments, etc.  Non-urgent messages can be sent to your provider as well.   To learn more about what you can do with MyChart, go to [email or web address].    Your next appointment:   3 month(s)  The format for your next appointment:   In Person  Provider:   Ralene Burger, MD    Other Instructions Referral to vascular surgeon- they will call  for appt

## 2023-11-27 NOTE — Telephone Encounter (Signed)
 Pt appt scheduled

## 2023-12-02 NOTE — Progress Notes (Unsigned)
 [facility]   ASSESSMENT / PLAN: Sharon Saunders is a 68 y.o. female with a {aorticaneurysms:24839} with*** rupture measuring ***mm.  The Joint Council of the American Association for Vascular Surgery and Society for Vascular Surgery estimates annual rupture risk based on abdominal aneurysm diameter. The patient's estimated risk is {aneurysmrupture:24841}  The patient is *** a candidate for elective repair of the aneurysm to prevent rupture.  I explained the risks / benefits / alternatives to different approaches to aortic reconstruction.   I explained the specific benefits of open repair including improved durability, limited requirement for surveillance, less need for secondary intervention. I explained the specific risks from open repair including higher physiologic stress from aortic cross clamping, higher risk of perioperative complication (including stroke, MI, pneumonia, renal insufficiency and failure, etc.), higher risk of abdominal wall complications, risk of anastomotic pseudoaneurysm.  I explained the specific benefits of endovascular repair including limited physiologic stress and less recovery time, lower risk of serious perioperative complication, zero risk of abdominal wall complication.  I explained the specific risks from endovascular repair including need for lifetime surveillance, risk of large-bore arterial access, risk of requiring secondary intervention to maintain seal or patency. I explained that not all patients are candidates for endovascular repair based on their unique anatomy.  After detailed discussion the patient and I agree that the best option for the patient is ***.   Recommend:  Abstinence from all tobacco products. Blood glucose control with goal A1c < 7%. Blood pressure control with goal blood pressure < 130/80 mmHg. Lipid reduction therapy with goal LDL-C < 55 mg/dL. Aspirin  81mg  by mouth daily. *** Clopidogrel  75mg  by  mouth daily. *** Rivaroxaban 2.5mg  by mouth twice daily. Atorvastatin  40-80mg  PO QD (or other high intensity statin therapy). *** Daily walking to and past the point of discomfort. ***Will refer to supervised exercise program.         CHIEF COMPLAINT: ***  HISTORY OF PRESENT ILLNESS: Sharon Saunders is a 68 y.o. female ***  VASCULAR SURGICAL HISTORY: ***  VASCULAR RISK FACTORS: {FINDINGS; POSITIVE NEGATIVE:[id number]} history of stroke / transient ischemic attack. {FINDINGS; POSITIVE NEGATIVE:[id number]} history of coronary artery disease. *** history of PCI. *** history of CABG.  {FINDINGS; POSITIVE NEGATIVE:[id number]} history of diabetes mellitus. Last A1c ***. {FINDINGS; POSITIVE NEGATIVE:[id number]} history of smoking. *** actively smoking. {FINDINGS; POSITIVE NEGATIVE:[id number]} history of hypertension. *** drug regimen with *** control. {FINDINGS; POSITIVE NEGATIVE:[id number]} history of chronic kidney disease.  Last GFR ***. CKD {stage:30421363}. {FINDINGS; POSITIVE NEGATIVE:[phone]} history of chronic obstructive pulmonary disease, treated with ***.  FUNCTIONAL STATUS: ECOG performance status: {findings; ecog performance status:31780} Ambulatory status: {TNHAmbulation:25868}  CAREY 1 AND 3 YEAR INDEX Female (2pts) 75-79 or 80-84 (2pts) >84 (3pts) Dependence in toileting (1pt) Partial or full dependence in dressing (1pt) History of malignant neoplasm (2pts) CHF (3pts) COPD (1pts) CKD (3pts)  0-3 pts 6% 1 year mortality ; 21% 3 year mortality 4-5 pts 12% 1 year mortality ; 36% 3 year mortality >5 pts 21% 1 year mortality; 54% 3 year mortality   Past Medical History:  Diagnosis Date   AAA (abdominal aortic aneurysm) without rupture (HCC)    ACS (acute coronary syndrome) (HCC) 12/28/2016   Arthritis    osteoarthritis knees   CAD (coronary artery disease)    a. s/p CABG in 1998;  b. s/p prior LAD stenting;  c. 12/2016 Cath: LM 20/50, LAD 50p, diffuse  27m ISR, 80d, 90apical, D1 100, LCX min irregs, OM3 30/40, RCA 40p,  50m, 40d, RPDA 50. EF 40-45%, mid antlat HK, focal apical aneurysmal segment.   Fibromyalgia    Hyperlipidemia    Hyperlipidemia LDL goal <70 07/30/2019   Hypertension    Ischemic cardiomyopathy    a. 12/2016 LV Gram: EF 40-45% w/ moderate mid anterolateral HK and a focal apical aneurysmal segment.   NSTEMI (non-ST elevated myocardial infarction) (HCC) 07/30/2019   Obesity    RBBB    Status post coronary artery bypass graft 08/28/2019   STEMI (ST elevation myocardial infarction) (HCC) 12/28/2016    Past Surgical History:  Procedure Laterality Date   APPENDECTOMY     BACK SURGERY     ruptured disc   CORONARY ARTERY BYPASS GRAFT     CORONARY STENT INTERVENTION N/A 07/31/2019   Procedure: CORONARY STENT INTERVENTION;  Surgeon: Swaziland, Peter M, MD;  Location: [facility] INVASIVE CV LAB;  Service: Cardiovascular;  Laterality: N/A;   EYE SURGERY     cataracts   LEFT HEART CATH AND CORONARY ANGIOGRAPHY N/A 12/28/2016   Procedure: LEFT HEART CATH AND CORONARY ANGIOGRAPHY;  Surgeon: Burnard Debby LABOR, MD;  Location: [facility] INVASIVE CV LAB;  Service: Cardiovascular;  Laterality: N/A;   LEFT HEART CATH AND CORS/GRAFTS ANGIOGRAPHY N/A 07/31/2019   Procedure: LEFT HEART CATH AND CORS/GRAFTS ANGIOGRAPHY;  Surgeon: Swaziland, Peter M, MD;  Location: [facility] INVASIVE CV LAB;  Service: Cardiovascular;  Laterality: N/A;    No family history on file.  Social History   Socioeconomic History   Marital status: Widowed    Spouse name: Not on file   Number of children: Not on file   Years of education: Not on file   Highest education level: Not on file  Occupational History   Not on file  Tobacco Use   Smoking status: Former    Current packs/day: 0.00    Types: Cigarettes    Quit date: 12/28/1996    Years since quitting: 26.9   Smokeless tobacco: Former  Building services engineer status: Never Used  Substance and Sexual Activity   Alcohol use: No   Drug  use: No   Sexual activity: Not Currently  Other Topics Concern   Not on file  Social History Narrative   Not on file   Social Drivers of Health   Financial Resource Strain: Not on file  Food Insecurity: Low Risk  (10/16/2023)   Received from [facility]   Hunger Vital Sign    Within the past 12 months, you worried that your food would run out before you got money to buy more: Never true    Within the past 12 months, the food you bought just didn't last and you didn't have money to get more. : Never true  Transportation Needs: No Transportation Needs (10/16/2023)   Received from [employer]    In the past 12 months, has lack of reliable transportation kept you from medical appointments, meetings, work or from getting things needed for daily living? : No  Physical Activity: Not on file  Stress: Not on file  Social Connections: Not on file  Intimate Partner Violence: Not on file    Allergies  Allergen Reactions   Phenergan [Promethazine] Anxiety, Rash and Other (See Comments)    Received a shot an an E.D.and immediately became very jittery   Quinolones     6.1 cm infrarenal AAA found May 2025.   Sulfa Antibiotics Rash and Dermatitis    Current Outpatient Medications  Medication Sig Dispense Refill  acetaminophen  (TYLENOL ) 500 MG tablet Take 500-1,000 mg by mouth every 6 (six) hours as needed for mild pain or moderate pain (for headaches or pain).     aspirin  81 MG chewable tablet Chew 1 tablet (81 mg total) by mouth daily.     atorvastatin  (LIPITOR ) 80 MG tablet Take 1 tablet by mouth once daily 90 tablet 2   clopidogrel  (PLAVIX ) 75 MG tablet Take 75 mg by mouth daily.     isosorbide  mononitrate (IMDUR ) 60 MG 24 hr tablet Take 1 tablet (60 mg total) by mouth daily. 90 tablet 2   losartan  (COZAAR ) 25 MG tablet Take 1 tablet by mouth once daily 90 tablet 2   metoprolol  succinate (TOPROL -XL) 50 MG 24 hr tablet Take 1 tablet (50 mg total) by mouth daily. 90  tablet 2   nitroGLYCERIN  (NITROSTAT ) 0.4 MG SL tablet Place 1 tablet (0.4 mg total) under the tongue every 5 (five) minutes as needed for chest pain. 25 tablet 11   pantoprazole  (PROTONIX ) 40 MG tablet Take 1 tablet (40 mg total) by mouth daily. 90 tablet 1   No current facility-administered medications for this visit.    PHYSICAL EXAM There were no vitals filed for this visit.  Constitutional: *** appearing. *** distress. Appears *** nourished.  Neurologic: CN ***. *** focal findings. *** sensory loss. Psychiatric: *** Mood and affect symmetric and appropriate. Eyes: *** No icterus. No conjunctival pallor. Ears, nose, throat: *** mucous membranes moist. Midline trachea.  Cardiac: *** rate and rhythm.  Respiratory: *** unlabored. Abdominal: *** soft, non-tender, non-distended.  Peripheral vascular: *** Extremity: *** edema. *** cyanosis. *** pallor.  Skin: *** gangrene. *** ulceration.  Lymphatic: *** Stemmer's sign. *** palpable lymphadenopathy.    PERTINENT LABORATORY AND RADIOLOGIC DATA  Most recent CBC    Latest Ref Rng & Units 06/10/2023   11:11 AM 08/01/2019    2:36 AM 07/31/2019   12:12 AM  CBC  WBC 3.4 - 10.8 x10E3/uL 5.4  7.2  9.9   Hemoglobin 11.1 - 15.9 g/dL 86.7  87.0  86.4   Hematocrit 34.0 - 46.6 % 40.7  39.4  40.7   Platelets 150 - 450 x10E3/uL 237  224  230      Most recent CMP    Latest Ref Rng & Units 06/10/2023   11:11 AM 02/25/2020   12:00 AM 09/25/2019    9:24 AM  CMP  Glucose 70 - 99 mg/dL 97  92  88   BUN 8 - 27 mg/dL 8  7  6    Creatinine 0.57 - 1.00 mg/dL 9.15  9.34  9.35   Sodium 134 - 144 mmol/L 141  142  142   Potassium 3.5 - 5.2 mmol/L 4.2  3.8  3.4   Chloride 96 - 106 mmol/L 103  104  99   CO2 20 - 29 mmol/L 25  26  27    Calcium  8.7 - 10.3 mg/dL 9.1  9.2  9.1   Total Protein 6.0 - 8.5 g/dL 6.4     Total Bilirubin 0.0 - 1.2 mg/dL 0.8     Alkaline Phos 44 - 121 IU/L 96     AST 0 - 40 IU/L 16     ALT 0 - 32 IU/L 10       Renal  function CrCl cannot be calculated (Patient's most recent lab result is older than the maximum 21 days allowed.).  Hgb A1c MFr Bld (%)  Date Value  07/31/2019 5.0    LDL  Chol Calc (NIH)  Date Value Ref Range Status  04/06/2022 51 0 - 99 mg/dL Final   LDL Direct  Date Value Ref Range Status  06/10/2023 91 0 - 99 mg/dL Final     Vascular Imaging: ***  Ashely Joshua N. [name], MD FACS [facility] Phone Number: [phone] 12/02/2023 9:03 PM   Total time spent on preparing this encounter including chart review, data review, collecting history, examining the patient, and coordinating care: {tnhtimebilling:26202} {billinglist:27273}  Portions of this report may have been transcribed using voice recognition software.  Every effort has been made to ensure accuracy; however, inadvertent computerized transcription errors may still be present.

## 2023-12-03 ENCOUNTER — Other Ambulatory Visit: Payer: Self-pay

## 2023-12-03 ENCOUNTER — Encounter: Payer: Self-pay | Admitting: Vascular Surgery

## 2023-12-03 ENCOUNTER — Ambulatory Visit: Attending: Vascular Surgery | Admitting: Vascular Surgery

## 2023-12-03 VITALS — BP 104/65 | HR 70 | Temp 97.7°F | Ht 65.5 in | Wt 157.0 lb

## 2023-12-03 DIAGNOSIS — I7143 Infrarenal abdominal aortic aneurysm, without rupture: Secondary | ICD-10-CM | POA: Diagnosis not present

## 2023-12-06 ENCOUNTER — Ambulatory Visit: Attending: Cardiology

## 2023-12-06 DIAGNOSIS — R0609 Other forms of dyspnea: Secondary | ICD-10-CM | POA: Diagnosis not present

## 2023-12-06 DIAGNOSIS — I5030 Unspecified diastolic (congestive) heart failure: Secondary | ICD-10-CM

## 2023-12-06 DIAGNOSIS — I088 Other rheumatic multiple valve diseases: Secondary | ICD-10-CM

## 2023-12-06 LAB — ECHOCARDIOGRAM COMPLETE
Area-P 1/2: 3.24 cm2
S' Lateral: 3.2 cm

## 2023-12-10 ENCOUNTER — Telehealth: Payer: Self-pay

## 2023-12-10 ENCOUNTER — Ambulatory Visit: Payer: Self-pay | Admitting: Cardiology

## 2023-12-10 NOTE — Telephone Encounter (Signed)
 Echo Results reviewed with pt as per Dr. Vanetta Shawl note.  Pt verbalized understanding and had no additional questions. Routed to PCP

## 2024-01-02 ENCOUNTER — Other Ambulatory Visit (HOSPITAL_BASED_OUTPATIENT_CLINIC_OR_DEPARTMENT_OTHER): Admitting: Radiology

## 2024-01-14 ENCOUNTER — Ambulatory Visit: Admitting: Vascular Surgery

## 2024-01-21 ENCOUNTER — Ambulatory Visit: Admitting: Vascular Surgery

## 2024-01-23 ENCOUNTER — Ambulatory Visit (INDEPENDENT_AMBULATORY_CARE_PROVIDER_SITE_OTHER)
Admission: RE | Admit: 2024-01-23 | Discharge: 2024-01-23 | Disposition: A | Discharge status: Home / Self Care | Source: Ambulatory Visit | Attending: Vascular Surgery | Admitting: Vascular Surgery

## 2024-01-23 DIAGNOSIS — I7143 Infrarenal abdominal aortic aneurysm, without rupture: Secondary | ICD-10-CM | POA: Diagnosis not present

## 2024-01-23 MED ORDER — IOHEXOL 350 MG/ML SOLN
100.0000 mL | Freq: Once | INTRAVENOUS | Status: AC | PRN
  Administered 2024-01-23: 80 mL via INTRAVENOUS

## 2024-02-06 NOTE — Progress Notes (Unsigned)
 Cardiology Office Note:  .   Date:  02/07/2024  ID:  Sharon Saunders, DOB [birth year], MRN [id number] PCP: [name], MD  [facility] Providers Cardiologist:  [name], MD (Inactive) Electrophysiologist:  [name], MD    History of Present Illness: .   Sharon Saunders is a 68 y.o. female with a past medical history of CAD s/p CABG 1998 >> DES to left circumflex 2021, AAA, hypertension, RBBB, ischemic cardiomyopathy, fibromyalgia, dyslipidemia.  08/04/2021 echo EF 50 to 55%, grade 1 DD 07/31/2019 cardiac cath severe three-vessel obstructive CAD, 2 vein grafts were noted to be occluded, s/p PCI of the left circumflex with DES x 1 07/31/2019 echo EF 40 to 45%, mild concentric LVH, grade 1 DD, severe hypokinesis of the LV entire lateral wall 12/28/2016 cardiac cath severe multivessel native CAD recommendations for medical therapy 1998 CABG x 4  In 1998 she underwent a four-vessel bypass.  In 2018 she had a cardiac catheterization revealing severe multivessel CAD with recommendations for medical therapy.  She then established care with [facility] in September 2018.  In 2021 she underwent heart catheterization requiring PCI of the left circumflex with DES x 1.  Most recently evaluated by Dr. [name] on 11/07/2023, this was apparently following a hospitalization for atypical chest pain however she apparently had had a CT of her abdomen revealing a diameter of 61 mm >> referred to vascular surgery and was evaluated by Dr. [name] on 12/03/2023, plans to pursue endovascular repair.   She presents today accompanied by her sister. She has been doing well, no formal complaints from a cardiac perspective. She will meet with [facility] next week to determine next steps for her upcoming surgery. She denies chest pain, palpitations, dyspnea, pnd, orthopnea, n, v, dizziness, syncope, edema, weight gain, or early satiety.   She will see [facility] next week.     ROS: Review of Systems  All  other systems reviewed and are negative.    Studies Reviewed: .        Cardiac Studies & Procedures   ______________________________________________________________________________________________ CARDIAC CATHETERIZATION  CARDIAC CATHETERIZATION 07/31/2019  Conclusion  Prox RCA lesion is 50% stenosed.  Mid RCA lesion is 50% stenosed.  RPDA lesion is 90% stenosed.  Ost LM to Mid LM lesion is 50% stenosed.  Mid LAD lesion is 80% stenosed.  Mid LAD to Dist LAD lesion is 90% stenosed.  Prox Cx to Mid Cx lesion is 95% stenosed.  Post intervention, there is a 0% residual stenosis.  A drug-eluting stent was successfully placed using a SYNERGY XD 3.0X24.  LV end diastolic pressure is mildly elevated.  1. Severe 3 vessel obstructive CAD - Diffuse 80% mid LAD at site of prior stent. The mid to distal LAD is diffusely diseased up to 90%. - 95% mid LCx. This is the culprit lesion - Diffuse ectasia of the RCA with 50% stenoses. The PDA is diffusely diseased up to 90% 2. 2 Vein grafts are noted to be occluded at proximal aortic anastomosis. 3. The LIMA was never used as a graft and is a large vessel. 4. Mildly elevated LVEDP 5. Successful PCI of the LCx with DES x 1.  Plan: DAPT for at least a year and probably indefinitely given high disease burden and risk. The LAD and PDA disease is not amenable to intervention. Anticipate DC in am.  Findings Coronary Findings Diagnostic  Dominance: Right  Left Main Ost LM to Mid LM lesion is 50% stenosed.  Left Anterior  Descending Mid LAD lesion is 80% stenosed. The lesion was previously treated. Mid LAD to Dist LAD lesion is 90% stenosed.  Left Circumflex Prox Cx to Mid Cx lesion is 95% stenosed.  First Obtuse Marginal Branch Vessel is small in size.  Right Coronary Artery Vessel was injected. Vessel is large. The vessel is moderately ectatic. Prox RCA lesion is 50% stenosed. Mid RCA lesion is 50% stenosed.  Right Posterior  Descending Artery RPDA lesion is 90% stenosed. The lesion is segmental.  Intervention  Prox Cx to Mid Cx lesion Stent CATHETER LAUNCHER 6FR EBU3.5 guide catheter was inserted. Lesion crossed with guidewire using a WIRE ASAHI PROWATER 180CM. Pre-stent angioplasty was performed using a BALLOON SAPPHIRE 2.5X12. A drug-eluting stent was successfully placed using a SYNERGY XD 3.0X24. Maximum pressure: 16 atm. Post-stent angioplasty was not performed. Post-Intervention Lesion Assessment The intervention was successful. Pre-interventional TIMI flow is 2. Post-intervention TIMI flow is 3. No complications occurred at this lesion. There is a 0% residual stenosis post intervention.   CARDIAC CATHETERIZATION  CARDIAC CATHETERIZATION 12/28/2016  Conclusion  LM-2 lesion, 50 %stenosed.  LM-1 lesion, 20 %stenosed.  Ost 1st Diag lesion, 100 %stenosed.  Prox LAD to Mid LAD lesion, 80 %stenosed.  Mid LAD to Dist LAD lesion, 80 %stenosed.  Dist LAD lesion, 90 %stenosed.  3rd Mrg lesion, 40 %stenosed.  Ost 3rd Mrg lesion, 30 %stenosed.  Prox RCA lesion, 40 %stenosed.  Mid RCA lesion, 50 %stenosed.  Dist RCA lesion, 40 %stenosed.  RPDA lesion, 50 %stenosed.  Moderate global LV dysfunction with an ejection fraction of 40-45% with moderate hypocontractility involving the mid distal anterolateral wall and focal aneurysmal apical segment.  Severe multivessel native CAD with 20% smooth distal left main tapering, 20 and 50% very proximal LAD stenoses, with diffuse 80% in-stent restenosis in the mid LAD.  Comment diffuse irregularity and narrowing of the mid distal LAD of 80% which reaches 90% apically and a small caliber; 30 and 50% stenoses in the circumflex marginal vessel; and diffusely irregular dominant RCA with stenoses of 40 1540% in the proximal, mid and mid-distal segment with 50% PDA stenosis.  There is no competitive filling from any graft to any vessel.  Two vein markers are visualized  in the aorta.  It is uncertain as to where these grafts had gone and whether or not they were sequential grafts.  Both grafts are occluded at its origin.  The left internal mammary artery was injected but apparently this did not anastomose into any coronary vessel.  RECOMMENDATION: Medical therapy.  I will start patient on aspirin  and Plavix .  Patient will be started on aggressive lipid-lowering therapy.  Nitrates will be added to medical regimen with beta blocker therapy with consideration of possible amlodipine.  We will try to obtain the surgical vascular records which are unavailable in Epic.  Findings Coronary Findings Diagnostic  Dominance: Right  Left Main There is mild distal left main tapering of 20% prior to its bifurcation into the LAD and left circumflex vessel  Left Anterior Descending The LAD has proximal 50% stenoses.  The proximal diagonal vessels are not visualized and may be occluded.  There is diffuse in-stent restenosis in the stent in the mid segment with narrowing of 80%, followed by diffuse irregularity with narrowing of 80%, extending to the apex leading up to 90% diffusely apically.  There is no competitive filling. The lesion was previously treated.  First Diagonal Branch  Left Circumflex The left circumflex has mild irregularity but is without  significant stenoses.  There is no competitive filling.  First Obtuse Marginal Branch Vessel is small in size.  Second Obtuse Marginal Branch Vessel is small in size.  Third Obtuse Marginal Branch  Right Coronary Artery The RCA is large caliber dominant vessel that has diffuse disease with narrowings of 40, 50 and 40% in the proximal, mid and mid-distal segment with 50% PDA stenosis.  There is no competitive filling from any graft.  Right Posterior Descending Artery  Intervention  No interventions have been documented.   STRESS TESTS  MYOCARDIAL PERFUSION IMAGING 10/15/2023    ECHOCARDIOGRAM  ECHOCARDIOGRAM COMPLETE 12/06/2023  Narrative ECHOCARDIOGRAM REPORT    Patient Name:   MALANIA GAWTHROP Date of Exam: 12/06/2023 Medical Rec #:  [id number]         Height:       65.5 in Accession #:    [id number]        Weight:       157.0 lb Date of Birth:  [birth year]         BSA:          1.795 m Patient Age:    68 years          BP:           104/65 mmHg Patient Gender: F                 HR:           72 bpm. Exam Location:  [facility]  Procedure: 2D Echo, Cardiac Doppler, Color Doppler and Strain Analysis (Both Spectral and Color Flow Doppler were utilized during procedure).  Indications:    Dyspnea on exertion [R06.09 (ICD-10-CM)]  History:        Patient has prior history of Echocardiogram examinations, most recent 08/04/2021. Cardiomyopathy, CAD and Previous Myocardial Infarction, Prior CABG; Risk Factors:Dyslipidemia and Former Smoker. Abdominal arctic aneurysm.  Sonographer:    Charlie Jointer RDCS Referring Phys: [id number] ROBERT J KRASOWSKI  IMPRESSIONS   1. Left ventricular ejection fraction, by estimation, is 50 to 55%. The left ventricle has low normal function. The left ventricle has no regional wall motion abnormalities. Left ventricular diastolic parameters are consistent with Grade I diastolic dysfunction (impaired relaxation). The average left ventricular global longitudinal strain is -14.3 %. The global longitudinal strain is abnormal. 2. Right ventricular systolic function is normal. The right ventricular size is normal. There is normal pulmonary artery systolic pressure. 3. Right atrial size was mildly dilated. 4. The mitral valve is normal in structure. No evidence of mitral valve regurgitation. No evidence of mitral stenosis. 5. The aortic valve is normal in structure. Aortic valve regurgitation is not visualized. No aortic stenosis is present. 6. The inferior vena cava is normal in size with greater than 50% respiratory variability,  suggesting right atrial pressure of 3 mmHg.  FINDINGS Left Ventricle: Left ventricular ejection fraction, by estimation, is 50 to 55%. The left ventricle has low normal function. The left ventricle has no regional wall motion abnormalities. The average left ventricular global longitudinal strain is -14.3 %. Strain was performed and the global longitudinal strain is abnormal. The left ventricular internal cavity size was normal in size. There is no left ventricular hypertrophy. Left ventricular diastolic parameters are consistent with Grade I diastolic dysfunction (impaired relaxation).  Right Ventricle: The right ventricular size is normal. No increase in right ventricular wall thickness. Right ventricular systolic function is normal. There is normal pulmonary artery systolic pressure. The tricuspid regurgitant velocity is 2.00 m/s,  and with an assumed right atrial pressure of 3 mmHg, the estimated right ventricular systolic pressure is 19.0 mmHg.  Left Atrium: Left atrial size was normal in size.  Right Atrium: Right atrial size was mildly dilated.  Pericardium: There is no evidence of pericardial effusion.  Mitral Valve: The mitral valve is normal in structure. No evidence of mitral valve regurgitation. No evidence of mitral valve stenosis.  Tricuspid Valve: The tricuspid valve is normal in structure. Tricuspid valve regurgitation is not demonstrated. No evidence of tricuspid stenosis.  Aortic Valve: The aortic valve is normal in structure. Aortic valve regurgitation is not visualized. No aortic stenosis is present.  Pulmonic Valve: The pulmonic valve was normal in structure. Pulmonic valve regurgitation is mild. No evidence of pulmonic stenosis.  Aorta: The aortic root is normal in size and structure.  Venous: The inferior vena cava is normal in size with greater than 50% respiratory variability, suggesting right atrial pressure of 3 mmHg.  IAS/Shunts: No atrial level shunt detected by  color flow Doppler.   LEFT VENTRICLE PLAX 2D LVIDd:         4.60 cm   Diastology LVIDs:         3.20 cm   LV e' medial:    7.61 cm/s LV PW:         1.00 cm   LV E/e' medial:  7.3 LV IVS:        1.00 cm   LV e' lateral:   12.95 cm/s LVOT diam:     2.00 cm   LV E/e' lateral: 4.3 LV SV:         61 LV SV Index:   34        2D Longitudinal Strain LVOT Area:     3.14 cm  2D Strain GLS Avg:     -14.3 %   RIGHT VENTRICLE             IVC RV Basal diam:  4.00 cm     IVC diam: 2.10 cm RV Mid diam:    3.50 cm RV S prime:     10.40 cm/s TAPSE (M-mode): 1.9 cm  LEFT ATRIUM             Index        RIGHT ATRIUM           Index LA diam:        3.20 cm 1.78 cm/m   RA Area:     22.30 cm LA Vol (A2C):   60.1 ml 33.48 ml/m  RA Volume:   66.10 ml  36.83 ml/m LA Vol (A4C):   46.0 ml 25.63 ml/m LA Biplane Vol: 54.3 ml 30.25 ml/m AORTIC VALVE             PULMONIC VALVE LVOT Vmax:   93.85 cm/s  PR End Diast Vel: 1.70 msec LVOT Vmean:  57.900 cm/s LVOT VTI:    0.195 m  AORTA Ao Root diam: 3.60 cm Ao Asc diam:  4.00 cm Ao Desc diam: 2.30 cm  MITRAL VALVE               TRICUSPID VALVE MV Area (PHT): 3.24 cm    TR Peak grad:   16.0 mmHg MV Decel Time: 234 msec    TR Vmax:        200.00 cm/s MV E velocity: 55.30 cm/s MV A velocity: 67.60 cm/s  SHUNTS MV E/A ratio:  0.82        Systemic  VTI:  0.20 m Systemic Diam: 2.00 cm  Lamar Fitch MD Electronically signed by Lamar Fitch MD Signature Date/Time: 12/06/2023/4:48:22 PM    Final          ______________________________________________________________________________________________      Risk Assessment/Calculations:             Physical Exam:   VS:  BP 95/68   Pulse 93   Ht 5' 5.5 (1.664 m)   Wt 156 lb (70.8 kg)   SpO2 96%   BMI 25.56 kg/m    Wt Readings from Last 3 Encounters:  02/07/24 156 lb (70.8 kg)  12/03/23 157 lb (71.2 kg)  11/07/23 161 lb 9.6 oz (73.3 kg)    GEN: Well nourished, well developed in  no acute distress NECK: No JVD; No carotid bruits CARDIAC: RRR, no murmurs, rubs, gallops RESPIRATORY:  Clear to auscultation without rales, wheezing or rhonchi  ABDOMEN: Soft, non-tender, non-distended EXTREMITIES:  No edema; No deformity   ASSESSMENT AND PLAN: .   CAD - CAD s/p CABG x 4 1998 >> DES to left circumflex 2021.Stable with no anginal symptoms. No indication for ischemic evaluation.  Continue aspirin  81 mg daily, continue Lipitor  80 mg daily, continue Imdur  60 mg daily, continue metoprolol  50 mg daily, continue nitroglycerin  as needed--has not needed.   Ischemic cardiomyopathy-most recent echo revealed an EF of 50 to 55%, NYHA class I, euvolemic.  Continue losartan  25 mg daily, continue metoprolol  50 mg daily.  HTN -blood pressure is well-controlled today at 95/68 , continue Cozaar  25 mg daily, continue Toprol  50 mg daily.  Dyslipidemia - most recent LDL was no at goal suggested she add Zetia but she is not currently taking this. Continue Lipitor  80 mg daily, followed by PCP.   AAA - followed by [facility], will see them next week.        Dispo: Follow up in 6 months.   Signed, Delon JAYSON Hoover, NP

## 2024-02-07 ENCOUNTER — Encounter: Payer: Self-pay | Admitting: Cardiology

## 2024-02-07 ENCOUNTER — Ambulatory Visit: Attending: Cardiology | Admitting: Cardiology

## 2024-02-07 VITALS — BP 95/68 | HR 93 | Ht 65.5 in | Wt 156.0 lb

## 2024-02-07 DIAGNOSIS — I255 Ischemic cardiomyopathy: Secondary | ICD-10-CM | POA: Diagnosis not present

## 2024-02-07 DIAGNOSIS — I25810 Atherosclerosis of coronary artery bypass graft(s) without angina pectoris: Secondary | ICD-10-CM

## 2024-02-07 DIAGNOSIS — Z951 Presence of aortocoronary bypass graft: Secondary | ICD-10-CM

## 2024-02-07 DIAGNOSIS — I4510 Unspecified right bundle-branch block: Secondary | ICD-10-CM

## 2024-02-07 DIAGNOSIS — E785 Hyperlipidemia, unspecified: Secondary | ICD-10-CM

## 2024-02-07 DIAGNOSIS — I714 Abdominal aortic aneurysm, without rupture, unspecified: Secondary | ICD-10-CM | POA: Diagnosis not present

## 2024-02-07 NOTE — Patient Instructions (Signed)
 Medication Instructions:  Your physician recommends that you continue on your current medications as directed. Please refer to the Current Medication list given to you today.  *If you need a refill on your cardiac medications before your next appointment, please call your pharmacy*  Lab Work: NONE If you have labs (blood work) drawn today and your tests are completely normal, you will receive your results only by: MyChart Message (if you have MyChart) OR A paper copy in the mail If you have any lab test that is abnormal or we need to change your treatment, we will call you to review the results.  Testing/Procedures: NONE  Follow-Up: At [facility], you and your health needs are our priority.  As part of our continuing mission to provide you with exceptional heart care, our providers are all part of one team.  This team includes your primary Cardiologist (physician) and Advanced Practice Providers or APPs (Physician Assistants and Nurse Practitioners) who all work together to provide you with the care you need, when you need it.  Your next appointment:   6 month(s)  Provider:   Lamar Fitch, MD    We recommend signing up for the patient portal called MyChart.  Sign up information is provided on this After Visit Summary.  MyChart is used to connect with patients for Virtual Visits (Telemedicine).  Patients are able to view lab/test results, encounter notes, upcoming appointments, etc.  Non-urgent messages can be sent to your provider as well.   To learn more about what you can do with MyChart, go to [email or web address].   Other Instructions Purchase over the counter Flonase and Claritin for allergy symptoms

## 2024-02-10 NOTE — H&P (View-Only) (Signed)
 [facility] OF [place]  ASSESSMENT / PLAN: Sharon Saunders is a 68 y.o. female with a 60 mm infrarenal abdominal aortic aneurysm.  Recommend:  Abstinence from all tobacco products. Blood glucose control with goal A1c < 7%. Blood pressure control with goal blood pressure < 130/80 mmHg. Lipid reduction therapy with goal LDL-C < 55 mg/dL. Aspirin  81mg  by mouth daily. Atorvastatin  40-80mg  PO QD (or other high intensity statin therapy).  Anatomy favorable for EVAR. Reviewed risks / benefits / alternatives. Plan repair as schedule allows in the near future.   CHIEF COMPLAINT: Aortic aneurysm  HISTORY OF PRESENT ILLNESS: Sharon Saunders is a 68 y.o. female referred to clinic for evaluation of infrarenal abdominal aortic aneurysm identified on outside duplex.  The patient is a resident of [place], [place] .  She is here with her daughter today.  We reviewed the duplex in detail.  We reviewed the natural history of abdominal aortic aneurysm disease in detail.  I counseled her about her low but real risk of rupture.  I counseled her about the 2 main modes of aortic reconstruction for aneurysm disease.   02/11/24. Patient returns to review her CT scan. She is doing well overall. We reviewed the scan in detail. Both she and her daughter had many excellent questions which I answered to the best of my ability.    Past Medical History:  Diagnosis Date   AAA (abdominal aortic aneurysm) without rupture (HCC)    ACS (acute coronary syndrome) (HCC) 12/28/2016   Arthritis    osteoarthritis knees   CAD (coronary artery disease)    a. s/p CABG in 1998;  b. s/p prior LAD stenting;  c. 12/2016 Cath: LM 20/50, LAD 50p, diffuse 4m ISR, 80d, 90apical, D1 100, LCX min irregs, OM3 30/40, RCA 40p, 38m, 40d, RPDA 50. EF 40-45%, mid antlat HK, focal apical aneurysmal segment.   Fibromyalgia    Hyperlipidemia    Hyperlipidemia LDL goal <70 07/30/2019   Hypertension    Ischemic  cardiomyopathy    a. 12/2016 LV Gram: EF 40-45% w/ moderate mid anterolateral HK and a focal apical aneurysmal segment.   NSTEMI (non-ST elevated myocardial infarction) (HCC) 07/30/2019   Obesity    RBBB    Status post coronary artery bypass graft 08/28/2019   STEMI (ST elevation myocardial infarction) (HCC) 12/28/2016    Past Surgical History:  Procedure Laterality Date   APPENDECTOMY     BACK SURGERY     ruptured disc   CORONARY ARTERY BYPASS GRAFT     CORONARY STENT INTERVENTION N/A 07/31/2019   Procedure: CORONARY STENT INTERVENTION;  Surgeon: Swaziland, Peter M, MD;  Location: [facility] INVASIVE CV LAB;  Service: Cardiovascular;  Laterality: N/A;   EYE SURGERY     cataracts   LEFT HEART CATH AND CORONARY ANGIOGRAPHY N/A 12/28/2016   Procedure: LEFT HEART CATH AND CORONARY ANGIOGRAPHY;  Surgeon: Burnard Debby LABOR, MD;  Location: [facility] INVASIVE CV LAB;  Service: Cardiovascular;  Laterality: N/A;   LEFT HEART CATH AND CORS/GRAFTS ANGIOGRAPHY N/A 07/31/2019   Procedure: LEFT HEART CATH AND CORS/GRAFTS ANGIOGRAPHY;  Surgeon: Swaziland, Peter M, MD;  Location: [facility] INVASIVE CV LAB;  Service: Cardiovascular;  Laterality: N/A;    No family history on file.  Social History   Socioeconomic History   Marital status: Widowed    Spouse name: Not on file   Number of children: Not on file   Years of education: Not on file   Highest education level: Not on  file  Occupational History   Not on file  Tobacco Use   Smoking status: Former    Current packs/day: 0.00    Types: Cigarettes    Quit date: 12/28/1996    Years since quitting: 27.1   Smokeless tobacco: Former  Building services engineer status: Never Used  Substance and Sexual Activity   Alcohol use: No   Drug use: No   Sexual activity: Not Currently  Other Topics Concern   Not on file  Social History Narrative   Not on file   Social Drivers of Health   Financial Resource Strain: Not on file  Food Insecurity: Low Risk  (10/16/2023)   Received from  [facility]   Hunger Vital Sign    Within the past 12 months, you worried that your food would run out before you got money to buy more: Never true    Within the past 12 months, the food you bought just didn't last and you didn't have money to get more. : Never true  Transportation Needs: No Transportation Needs (10/16/2023)   Received from [employer]    In the past 12 months, has lack of reliable transportation kept you from medical appointments, meetings, work or from getting things needed for daily living? : No  Physical Activity: Not on file  Stress: Not on file  Social Connections: Not on file  Intimate Partner Violence: Not on file    Allergies  Allergen Reactions   Phenergan [Promethazine] Anxiety, Rash and Other (See Comments)    Received a shot an an E.D.and immediately became very jittery   Quinolones     6.1 cm infrarenal AAA found May 2025.   Sulfa Antibiotics Rash and Dermatitis    Current Outpatient Medications  Medication Sig Dispense Refill   acetaminophen  (TYLENOL ) 500 MG tablet Take 500-1,000 mg by mouth every 6 (six) hours as needed for mild pain or moderate pain (for headaches or pain).     aspirin  81 MG chewable tablet Chew 1 tablet (81 mg total) by mouth daily.     atorvastatin  (LIPITOR ) 80 MG tablet Take 1 tablet by mouth once daily 90 tablet 2   isosorbide  mononitrate (IMDUR ) 60 MG 24 hr tablet Take 1 tablet (60 mg total) by mouth daily. 90 tablet 2   losartan  (COZAAR ) 25 MG tablet Take 1 tablet by mouth once daily 90 tablet 2   metoprolol  succinate (TOPROL -XL) 50 MG 24 hr tablet Take 1 tablet (50 mg total) by mouth daily. 90 tablet 2   nitroGLYCERIN  (NITROSTAT ) 0.4 MG SL tablet Place 1 tablet (0.4 mg total) under the tongue every 5 (five) minutes as needed for chest pain. 25 tablet 11   pantoprazole  (PROTONIX ) 40 MG tablet Take 1 tablet (40 mg total) by mouth daily. 90 tablet 1   No current facility-administered medications for this  visit.    PHYSICAL EXAM Vitals:   02/11/24 0823  BP: 107/75  Pulse: 70  Temp: 97.8 F (36.6 C)  Weight: 156 lb (70.8 kg)  Height: 5' 5 (1.651 m)     Elderly woman in no distress The rate and rhythm Unlabored breathing Palpable radial pulses bilaterally Palpable popliteal pulses bilaterally Palpable dorsalis pedis pulses bilaterally Soft, nontender abdomen.  I am not able to palpate an aneurysm.  PERTINENT LABORATORY AND RADIOLOGIC DATA  Most recent CBC    Latest Ref Rng & Units 06/10/2023   11:11 AM 08/01/2019    2:36 AM 07/31/2019  12:12 AM  CBC  WBC 3.4 - 10.8 x10E3/uL 5.4  7.2  9.9   Hemoglobin 11.1 - 15.9 g/dL 86.7  87.0  86.4   Hematocrit 34.0 - 46.6 % 40.7  39.4  40.7   Platelets 150 - 450 x10E3/uL 237  224  230      Most recent CMP    Latest Ref Rng & Units 06/10/2023   11:11 AM 02/25/2020   12:00 AM 09/25/2019    9:24 AM  CMP  Glucose 70 - 99 mg/dL 97  92  88   BUN 8 - 27 mg/dL 8  7  6    Creatinine 0.57 - 1.00 mg/dL 9.15  9.34  9.35   Sodium 134 - 144 mmol/L 141  142  142   Potassium 3.5 - 5.2 mmol/L 4.2  3.8  3.4   Chloride 96 - 106 mmol/L 103  104  99   CO2 20 - 29 mmol/L 25  26  27    Calcium  8.7 - 10.3 mg/dL 9.1  9.2  9.1   Total Protein 6.0 - 8.5 g/dL 6.4     Total Bilirubin 0.0 - 1.2 mg/dL 0.8     Alkaline Phos 44 - 121 IU/L 96     AST 0 - 40 IU/L 16     ALT 0 - 32 IU/L 10       Renal function CrCl cannot be calculated (Patient's most recent lab result is older than the maximum 21 days allowed.).  Hgb A1c MFr Bld (%)  Date Value  07/31/2019 5.0    LDL Chol Calc (NIH)  Date Value Ref Range Status  04/06/2022 51 0 - 99 mg/dL Final   LDL Direct  Date Value Ref Range Status  06/10/2023 91 0 - 99 mg/dL Final    CT angiogram. Personally reviewed. Scan shows 60mm infrarenal abdominal aortic aneurysm. Aneurysm neck is suitable for EVAR. Common femoral and iliac access is suitable for EVAR.   Debby SAILOR. [name], MD [facility] Vascular and Vein  Specialists of [facility] Phone Number: [phone] 02/10/2024 12:21 PM   Total time spent on preparing this encounter including chart review, data review, collecting history, examining the patient, and coordinating care: 30 min.  Portions of this report may have been transcribed using voice recognition software.  Every effort has been made to ensure accuracy; however, inadvertent computerized transcription errors may still be present.

## 2024-02-10 NOTE — Progress Notes (Unsigned)
 [facility] VEIN SPECIALISTS OF [place]  ASSESSMENT / PLAN: Sharon Saunders is a 68 y.o. female with a 60 mm infrarenal abdominal aortic aneurysm.  Recommend:  Abstinence from all tobacco products. Blood glucose control with goal A1c < 7%. Blood pressure control with goal blood pressure < 130/80 mmHg. Lipid reduction therapy with goal LDL-C < 55 mg/dL. Aspirin  81mg  by mouth daily. Atorvastatin  40-80mg  PO QD (or other high intensity statin therapy).  Anatomy favorable for EVAR. Reviewed risks / benefits / alternatives. Plan repair as schedule allows in the near future.   CHIEF COMPLAINT: Aortic aneurysm  HISTORY OF PRESENT ILLNESS: Sharon Saunders is a 68 y.o. female referred to clinic for evaluation of infrarenal abdominal aortic aneurysm identified on outside duplex.  The patient is a resident of [place], [place] .  She is here with her daughter today.  We reviewed the duplex in detail.  We reviewed the natural history of abdominal aortic aneurysm disease in detail.  I counseled her about her low but real risk of rupture.  I counseled her about the 2 main modes of aortic reconstruction for aneurysm disease.   02/11/24. Patient returns to review her CT scan. She is doing well overall. We reviewed the scan in detail. Both she and her daughter had many excellent questions which I answered to the best of my ability.    Past Medical History:  Diagnosis Date   AAA (abdominal aortic aneurysm) without rupture (HCC)    ACS (acute coronary syndrome) (HCC) 12/28/2016   Arthritis    osteoarthritis knees   CAD (coronary artery disease)    a. s/p CABG in 1998;  b. s/p prior LAD stenting;  c. 12/2016 Cath: LM 20/50, LAD 50p, diffuse 67m ISR, 80d, 90apical, D1 100, LCX min irregs, OM3 30/40, RCA 40p, 17m, 40d, RPDA 50. EF 40-45%, mid antlat HK, focal apical aneurysmal segment.   Fibromyalgia    Hyperlipidemia    Hyperlipidemia LDL goal <70 07/30/2019   Hypertension    Ischemic  cardiomyopathy    a. 12/2016 LV Gram: EF 40-45% w/ moderate mid anterolateral HK and a focal apical aneurysmal segment.   NSTEMI (non-ST elevated myocardial infarction) (HCC) 07/30/2019   Obesity    RBBB    Status post coronary artery bypass graft 08/28/2019   STEMI (ST elevation myocardial infarction) (HCC) 12/28/2016    Past Surgical History:  Procedure Laterality Date   APPENDECTOMY     BACK SURGERY     ruptured disc   CORONARY ARTERY BYPASS GRAFT     CORONARY STENT INTERVENTION N/A 07/31/2019   Procedure: CORONARY STENT INTERVENTION;  Surgeon: Swaziland, Peter M, MD;  Location: [facility] INVASIVE CV LAB;  Service: Cardiovascular;  Laterality: N/A;   EYE SURGERY     cataracts   LEFT HEART CATH AND CORONARY ANGIOGRAPHY N/A 12/28/2016   Procedure: LEFT HEART CATH AND CORONARY ANGIOGRAPHY;  Surgeon: Burnard Debby LABOR, MD;  Location: [facility] INVASIVE CV LAB;  Service: Cardiovascular;  Laterality: N/A;   LEFT HEART CATH AND CORS/GRAFTS ANGIOGRAPHY N/A 07/31/2019   Procedure: LEFT HEART CATH AND CORS/GRAFTS ANGIOGRAPHY;  Surgeon: Swaziland, Peter M, MD;  Location: [facility] INVASIVE CV LAB;  Service: Cardiovascular;  Laterality: N/A;    No family history on file.  Social History   Socioeconomic History   Marital status: Widowed    Spouse name: Not on file   Number of children: Not on file   Years of education: Not on file   Highest education level: Not on  file  Occupational History   Not on file  Tobacco Use   Smoking status: Former    Current packs/day: 0.00    Types: Cigarettes    Quit date: 12/28/1996    Years since quitting: 27.1   Smokeless tobacco: Former  Building services engineer status: Never Used  Substance and Sexual Activity   Alcohol use: No   Drug use: No   Sexual activity: Not Currently  Other Topics Concern   Not on file  Social History Narrative   Not on file   Social Drivers of Health   Financial Resource Strain: Not on file  Food Insecurity: Low Risk  (10/16/2023)   Received from  [facility]   Hunger Vital Sign    Within the past 12 months, you worried that your food would run out before you got money to buy more: Never true    Within the past 12 months, the food you bought just didn't last and you didn't have money to get more. : Never true  Transportation Needs: No Transportation Needs (10/16/2023)   Received from [employer]    In the past 12 months, has lack of reliable transportation kept you from medical appointments, meetings, work or from getting things needed for daily living? : No  Physical Activity: Not on file  Stress: Not on file  Social Connections: Not on file  Intimate Partner Violence: Not on file    Allergies  Allergen Reactions   Phenergan [Promethazine] Anxiety, Rash and Other (See Comments)    Received a shot an an E.D.and immediately became very jittery   Quinolones     6.1 cm infrarenal AAA found May 2025.   Sulfa Antibiotics Rash and Dermatitis    Current Outpatient Medications  Medication Sig Dispense Refill   acetaminophen  (TYLENOL ) 500 MG tablet Take 500-1,000 mg by mouth every 6 (six) hours as needed for mild pain or moderate pain (for headaches or pain).     aspirin  81 MG chewable tablet Chew 1 tablet (81 mg total) by mouth daily.     atorvastatin  (LIPITOR ) 80 MG tablet Take 1 tablet by mouth once daily 90 tablet 2   isosorbide  mononitrate (IMDUR ) 60 MG 24 hr tablet Take 1 tablet (60 mg total) by mouth daily. 90 tablet 2   losartan  (COZAAR ) 25 MG tablet Take 1 tablet by mouth once daily 90 tablet 2   metoprolol  succinate (TOPROL -XL) 50 MG 24 hr tablet Take 1 tablet (50 mg total) by mouth daily. 90 tablet 2   nitroGLYCERIN  (NITROSTAT ) 0.4 MG SL tablet Place 1 tablet (0.4 mg total) under the tongue every 5 (five) minutes as needed for chest pain. 25 tablet 11   pantoprazole  (PROTONIX ) 40 MG tablet Take 1 tablet (40 mg total) by mouth daily. 90 tablet 1   No current facility-administered medications for this  visit.    PHYSICAL EXAM Vitals:   02/11/24 0823  BP: 107/75  Pulse: 70  Temp: 97.8 F (36.6 C)  Weight: 156 lb (70.8 kg)  Height: 5' 5 (1.651 m)     Elderly woman in no distress The rate and rhythm Unlabored breathing Palpable radial pulses bilaterally Palpable popliteal pulses bilaterally Palpable dorsalis pedis pulses bilaterally Soft, nontender abdomen.  I am not able to palpate an aneurysm.  PERTINENT LABORATORY AND RADIOLOGIC DATA  Most recent CBC    Latest Ref Rng & Units 06/10/2023   11:11 AM 08/01/2019    2:36 AM 07/31/2019  12:12 AM  CBC  WBC 3.4 - 10.8 x10E3/uL 5.4  7.2  9.9   Hemoglobin 11.1 - 15.9 g/dL 86.7  87.0  86.4   Hematocrit 34.0 - 46.6 % 40.7  39.4  40.7   Platelets 150 - 450 x10E3/uL 237  224  230      Most recent CMP    Latest Ref Rng & Units 06/10/2023   11:11 AM 02/25/2020   12:00 AM 09/25/2019    9:24 AM  CMP  Glucose 70 - 99 mg/dL 97  92  88   BUN 8 - 27 mg/dL 8  7  6    Creatinine 0.57 - 1.00 mg/dL 9.15  9.34  9.35   Sodium 134 - 144 mmol/L 141  142  142   Potassium 3.5 - 5.2 mmol/L 4.2  3.8  3.4   Chloride 96 - 106 mmol/L 103  104  99   CO2 20 - 29 mmol/L 25  26  27    Calcium  8.7 - 10.3 mg/dL 9.1  9.2  9.1   Total Protein 6.0 - 8.5 g/dL 6.4     Total Bilirubin 0.0 - 1.2 mg/dL 0.8     Alkaline Phos 44 - 121 IU/L 96     AST 0 - 40 IU/L 16     ALT 0 - 32 IU/L 10       Renal function CrCl cannot be calculated (Patient's most recent lab result is older than the maximum 21 days allowed.).  Hgb A1c MFr Bld (%)  Date Value  07/31/2019 5.0    LDL Chol Calc (NIH)  Date Value Ref Range Status  04/06/2022 51 0 - 99 mg/dL Final   LDL Direct  Date Value Ref Range Status  06/10/2023 91 0 - 99 mg/dL Final    CT angiogram. Personally reviewed. Scan shows 60mm infrarenal abdominal aortic aneurysm. Aneurysm neck is suitable for EVAR. Common femoral and iliac access is suitable for EVAR.   Debby SAILOR. [name], MD [facility] Vascular and Vein  Specialists of [facility] Phone Number: [phone] 02/10/2024 12:21 PM   Total time spent on preparing this encounter including chart review, data review, collecting history, examining the patient, and coordinating care: 30 min.  Portions of this report may have been transcribed using voice recognition software.  Every effort has been made to ensure accuracy; however, inadvertent computerized transcription errors may still be present.

## 2024-02-11 ENCOUNTER — Other Ambulatory Visit: Payer: Self-pay

## 2024-02-11 ENCOUNTER — Encounter: Payer: Self-pay | Admitting: Vascular Surgery

## 2024-02-11 ENCOUNTER — Ambulatory Visit: Attending: Vascular Surgery | Admitting: Vascular Surgery

## 2024-02-11 VITALS — BP 107/75 | HR 70 | Temp 97.8°F | Ht 65.0 in | Wt 156.0 lb

## 2024-02-11 DIAGNOSIS — I7143 Infrarenal abdominal aortic aneurysm, without rupture: Secondary | ICD-10-CM

## 2024-02-28 ENCOUNTER — Other Ambulatory Visit (HOSPITAL_COMMUNITY)

## 2024-02-28 ENCOUNTER — Encounter (HOSPITAL_COMMUNITY): Payer: Self-pay

## 2024-02-28 NOTE — Progress Notes (Signed)
 Surgical Instructions   Your procedure is scheduled on Thursday March 05, 2024. Report to [facility] Main Entrance A at 7:45 A.M., then check in with the Admitting office. Any questions or running late day of surgery: call [phone]  Questions prior to your surgery date: call [phone], Monday-Friday, 8am-4pm. If you experience any cold or flu symptoms such as cough, fever, chills, shortness of breath, etc. between now and your scheduled surgery, please notify us  at the above number.    Remember:  Do not eat or drink after midnight the night before your surgery  Take these medicines the morning of surgery with A SIP OF WATER  acetaminophen  (TYLENOL )  aspirin   atorvastatin  (LIPITOR )  isosorbide  mononitrate (IMDUR )  metoprolol  succinate (TOPROL -XL)  pantoprazole  (PROTONIX )   May take these medicines IF NEEDED: nitroGLYCERIN  (NITROSTAT ) 0.4 MG SL tablet If you have to take this medication prior to surgery, please call [phone] and report this to a nurse  One week prior to surgery, STOP taking any Aleve, Naproxen, Ibuprofen, Motrin, Advil, Goody's, BC's, all herbal medications, fish oil, and non-prescription vitamins.                     Do NOT Smoke (Tobacco/Vaping) for 24 hours prior to your procedure.  If you use a CPAP at night, you may bring your mask/headgear for your overnight stay.   You will be asked to remove any contacts, glasses, piercing's, hearing aid's, dentures/partials prior to surgery. Please bring cases for these items if needed.    Patients discharged the day of surgery will not be allowed to drive home, and someone needs to stay with them for 24 hours.  SURGICAL WAITING ROOM VISITATION Patients may have no more than 2 support people in the waiting area - these visitors may rotate.   Pre-op nurse will coordinate an appropriate time for 1 ADULT support person, who may not rotate, to accompany patient in pre-op.  Children under the age of 46 must have  an adult with them who is not the patient and must remain in the main waiting area with an adult.  If the patient needs to stay at the hospital during part of their recovery, the visitor guidelines for inpatient rooms apply.  Please refer to the [facility] website for the visitor guidelines for any additional information.   If you received a COVID test during your pre-op visit  it is requested that you wear a mask when out in public, stay away from anyone that may not be feeling well and notify your surgeon if you develop symptoms. If you have been in contact with anyone that has tested positive in the last 10 days please notify you surgeon.      Pre-operative CHG Bathing Instructions   You can play a key role in reducing the risk of infection after surgery. Your skin needs to be as free of germs as possible. You can reduce the number of germs on your skin by washing with CHG (chlorhexidine gluconate) soap before surgery. CHG is an antiseptic soap that kills germs and continues to kill germs even after washing.   DO NOT use if you have an allergy to chlorhexidine/CHG or antibacterial soaps. If your skin becomes reddened or irritated, stop using the CHG and notify one of our RNs at [phone].              TAKE A SHOWER THE NIGHT BEFORE SURGERY   Please keep in mind the following:  DO  NOT shave, including legs and underarms, 48 hours prior to surgery.   Place clean sheets on your bed the night before surgery Use a clean washcloth (not used since being washed) for shower. DO NOT sleep with pet's night before surgery.  CHG Shower Instructions:  Wash your face and private area with normal soap. If you choose to wash your hair, wash first with your normal shampoo.  After you use shampoo/soap, rinse your hair and body thoroughly to remove shampoo/soap residue.  Turn the water OFF and apply half the bottle of CHG soap to a CLEAN washcloth.  Apply CHG soap ONLY FROM YOUR NECK DOWN TO YOUR  TOES (washing for 3-5 minutes)  DO NOT use CHG soap on face, private areas, open wounds, or sores.  Pay special attention to the area where your surgery is being performed.  If you are having back surgery, having someone wash your back for you may be helpful. Wait 2 minutes after CHG soap is applied, then you may rinse off the CHG soap.  Pat dry with a clean towel  Put on clean pajamas    Additional instructions for the day of surgery: If you choose, you may shower the morning of surgery with an antibacterial soap.  DO NOT APPLY any lotions, deodorants or perfumes.   Do not wear jewelry or makeup Do not wear nail polish, gel polish, artificial nails, or any other type of covering on natural nails (fingers and toes) Do not bring valuables to the hospital. [facility] is not responsible for valuables/personal belongings. Put on clean/comfortable clothes.  Please brush your teeth.  Ask your nurse before applying any prescription medications to the skin.

## 2024-02-28 NOTE — Progress Notes (Signed)
 PCP - Dr Norleen Neer Cardiologist - Dr Lamar Fitch  (last OV 11/07/23) EP - Dr WIll Inocencio   CT Chest x-ray - 01/23/24 EKG - 11/07/23 Stress Test - 10/15/23 CE ECHO - 12/06/23 Cardiac Cath - 07/31/19  ICD Pacemaker/Loop - n/a  Sleep Study -  n/a  Diabetes -  n/a  Blood Thinner Instructions:  n/a  Aspirin  Instructions: Continue per MD.  NPO   Anesthesia review: Yes  STOP now taking any Aspirin  (unless otherwise instructed by your surgeon), Aleve, Naproxen, Ibuprofen, Motrin, Advil, Goody's, BC's, all herbal medications, fish oil, and all vitamins.   Coronavirus Screening Do you have any of the following symptoms:  Cough yes/no: No Fever (>100.33F)  yes/no: No Runny nose occasional r/t allergies Sore throat yes/no: No Difficulty breathing/shortness of breath  yes/no: No  Have you traveled in the last 14 days and where? yes/no: No  Patient verbalized understanding of instructions that were given to them at the PAT appointment. Patient was also instructed that they will need to review over the PAT instructions again at home before surgery.

## 2024-03-02 ENCOUNTER — Encounter (HOSPITAL_COMMUNITY): Payer: Self-pay

## 2024-03-02 ENCOUNTER — Other Ambulatory Visit: Payer: Self-pay

## 2024-03-02 ENCOUNTER — Encounter (HOSPITAL_COMMUNITY)
Admission: RE | Admit: 2024-03-02 | Discharge: 2024-03-02 | Disposition: A | Discharge status: Home / Self Care | Source: Ambulatory Visit | Attending: Vascular Surgery | Admitting: Vascular Surgery

## 2024-03-02 VITALS — BP 124/81 | HR 71 | Temp 98.3°F | Resp 16 | Ht 65.0 in | Wt 160.2 lb

## 2024-03-02 DIAGNOSIS — I2510 Atherosclerotic heart disease of native coronary artery without angina pectoris: Secondary | ICD-10-CM | POA: Insufficient documentation

## 2024-03-02 DIAGNOSIS — Z87891 Personal history of nicotine dependence: Secondary | ICD-10-CM | POA: Insufficient documentation

## 2024-03-02 DIAGNOSIS — Z01812 Encounter for preprocedural laboratory examination: Secondary | ICD-10-CM | POA: Insufficient documentation

## 2024-03-02 DIAGNOSIS — Z955 Presence of coronary angioplasty implant and graft: Secondary | ICD-10-CM | POA: Insufficient documentation

## 2024-03-02 DIAGNOSIS — K219 Gastro-esophageal reflux disease without esophagitis: Secondary | ICD-10-CM | POA: Insufficient documentation

## 2024-03-02 DIAGNOSIS — I10 Essential (primary) hypertension: Secondary | ICD-10-CM | POA: Insufficient documentation

## 2024-03-02 DIAGNOSIS — I255 Ischemic cardiomyopathy: Secondary | ICD-10-CM | POA: Insufficient documentation

## 2024-03-02 DIAGNOSIS — Z01818 Encounter for other preprocedural examination: Secondary | ICD-10-CM

## 2024-03-02 DIAGNOSIS — I7143 Infrarenal abdominal aortic aneurysm, without rupture: Secondary | ICD-10-CM | POA: Insufficient documentation

## 2024-03-02 DIAGNOSIS — Z79899 Other long term (current) drug therapy: Secondary | ICD-10-CM | POA: Insufficient documentation

## 2024-03-02 DIAGNOSIS — I4510 Unspecified right bundle-branch block: Secondary | ICD-10-CM | POA: Insufficient documentation

## 2024-03-02 DIAGNOSIS — Z951 Presence of aortocoronary bypass graft: Secondary | ICD-10-CM | POA: Insufficient documentation

## 2024-03-02 DIAGNOSIS — E785 Hyperlipidemia, unspecified: Secondary | ICD-10-CM | POA: Insufficient documentation

## 2024-03-02 HISTORY — DX: Gastro-esophageal reflux disease without esophagitis: K21.9

## 2024-03-02 LAB — URINALYSIS, ROUTINE W REFLEX MICROSCOPIC
Bacteria, UA: NONE SEEN
Bilirubin Urine: NEGATIVE
Glucose, UA: NEGATIVE mg/dL
Ketones, ur: NEGATIVE mg/dL
Leukocytes,Ua: NEGATIVE
Nitrite: NEGATIVE
Protein, ur: NEGATIVE mg/dL
Specific Gravity, Urine: 1.008 (ref 1.005–1.030)
pH: 6 (ref 5.0–8.0)

## 2024-03-02 LAB — COMPREHENSIVE METABOLIC PANEL WITH GFR
ALT: 15 U/L (ref 0–44)
AST: 20 U/L (ref 15–41)
Albumin: 4 g/dL (ref 3.5–5.0)
Alkaline Phosphatase: 89 U/L (ref 38–126)
Anion gap: 10 (ref 5–15)
BUN: 8 mg/dL (ref 8–23)
CO2: 28 mmol/L (ref 22–32)
Calcium: 9.1 mg/dL (ref 8.9–10.3)
Chloride: 104 mmol/L (ref 98–111)
Creatinine, Ser: 0.82 mg/dL (ref 0.44–1.00)
GFR, Estimated: >60 mL/min (ref >60)
Glucose, Bld: 91 mg/dL (ref 70–99)
Potassium: 4 mmol/L (ref 3.5–5.1)
Sodium: 142 mmol/L (ref 135–145)
Total Bilirubin: 1 mg/dL (ref 0.0–1.2)
Total Protein: 7 g/dL (ref 6.5–8.1)

## 2024-03-02 LAB — CBC
HCT: 43 % (ref 36.0–46.0)
Hemoglobin: 14.3 g/dL (ref 12.0–15.0)
MCH: 32.9 pg (ref 26.0–34.0)
MCHC: 33.3 g/dL (ref 30.0–36.0)
MCV: 98.9 fL (ref 80.0–100.0)
Platelets: 173 K/uL (ref 150–400)
RBC: 4.35 MIL/uL (ref 3.87–5.11)
RDW: 12.7 % (ref 11.5–15.5)
WBC: 6.3 K/uL (ref 4.0–10.5)
nRBC: 0 % (ref 0.0–0.2)

## 2024-03-02 LAB — TYPE AND SCREEN
ABO/RH(D): O POS
Antibody Screen: NEGATIVE

## 2024-03-02 LAB — SURGICAL PCR SCREEN
MRSA, PCR: NEGATIVE
Staphylococcus aureus: NEGATIVE

## 2024-03-02 LAB — PROTIME-INR
INR: 1.1 (ref 0.8–1.2)
Prothrombin Time: 14.3 s (ref 11.4–15.2)

## 2024-03-02 LAB — APTT: aPTT: 32 s (ref 24–36)

## 2024-03-03 NOTE — Anesthesia Preprocedure Evaluation (Signed)
 Anesthesia Evaluation  Patient identified by MRN, date of birth, ID band Patient awake    Reviewed: Allergy & Precautions, NPO status , Patient's Chart, lab work & pertinent test results  History of Anesthesia Complications Negative for: history of anesthetic complications  Airway Mallampati: II  TM Distance: >3 FB Neck ROM: Full    Dental  (+) Missing, Dental Advisory Given, Poor Dentition   Pulmonary neg shortness of breath, neg sleep apnea, neg COPD, neg recent URI, former smoker   breath sounds clear to auscultation       Cardiovascular hypertension, + CAD and + Past MI  + dysrhythmias  Rhythm:Regular     Neuro/Psych neg Seizures  Neuromuscular disease    GI/Hepatic Neg liver ROS,GERD  ,Patient did not received Oral Contrast Agents,  Endo/Other  negative endocrine ROS    Renal/GU negative Renal ROS     Musculoskeletal  (+) Arthritis ,  Fibromyalgia -  Abdominal   Peds  Hematology negative hematology ROS (+)   Anesthesia Other Findings   Reproductive/Obstetrics                              Anesthesia Physical Anesthesia Plan  ASA: 3  Anesthesia Plan: General   Post-op Pain Management: Minimal or no pain anticipated   Induction: Intravenous  PONV Risk Score and Plan: 4 or greater and Ondansetron  and Dexamethasone  Airway Management Planned: Oral ETT  Additional Equipment: Arterial line  Intra-op Plan:   Post-operative Plan: Extubation in OR  Informed Consent: I have reviewed the patients History and Physical, chart, labs and discussed the procedure including the risks, benefits and alternatives for the proposed anesthesia with the patient or authorized representative who has indicated his/her understanding and acceptance.     Dental advisory given  Plan Discussed with: CRNA  Anesthesia Plan Comments: (PAT note by Lynwood Hope, PA-C: 68 year old female follows with  cardiology for history of CAD s/p CABG 1998 and DES to left circumflex thousand 1, AAA, HTN, right bundle branch block, ischemic cardiomyopathy, HLD.  Echo 11/2023 with LVEF 50 to 55%, grade 1 DD, normal RV, no significant valvular abnormalities.  Lexiscan 09/2023 was nonischemic.  Last seen in follow-up by Delon Hoover, NP on 02/07/2024.  Noted to be stable at that time, no anginal symptoms, no indication for ischemic evaluation.  Current medications were continued.  He was also discussed to be following up with vascular surgery for endovascular repair of 6.1 cm AAA.  Other pertinent history includes former smoker (quit 1998), GERD on PPI.  Preop labs reviewed, WNL.  EKG 11/07/2023: Sinus rhythm with first-degree AV block.  Rate 66.  TTE 12/06/2023: 1. Left ventricular ejection fraction, by estimation, is 50 to 55%. The  left ventricle has low normal function. The left ventricle has no regional  wall motion abnormalities. Left ventricular diastolic parameters are  consistent with Grade I diastolic  dysfunction (impaired relaxation). The average left ventricular global  longitudinal strain is -14.3 %. The global longitudinal strain is  abnormal.  2. Right ventricular systolic function is normal. The right ventricular  size is normal. There is normal pulmonary artery systolic pressure.  3. Right atrial size was mildly dilated.  4. The mitral valve is normal in structure. No evidence of mitral valve  regurgitation. No evidence of mitral stenosis.  5. The aortic valve is normal in structure. Aortic valve regurgitation is  not visualized. No aortic stenosis is present.  6. The  inferior vena cava is normal in size with greater than 50%  respiratory variability, suggesting right atrial pressure of 3 mmHg.   Nuclear stress 10/15/2023: Summary and conclusions: 1.  Very small area of ischemia involving midportion of the anterior lateral wall. 2.  Normal gated images. 3.  Normal ejection  fraction calculated of 75%. Comments: Fixed defect involving basal midportion inferior lateral wall most likely represents diaphragmatic attenuation.  Cath and PCI 07/31/2019:   Prox RCA lesion is 50% stenosed.  Mid RCA lesion is 50% stenosed.  RPDA lesion is 90% stenosed.  Ost LM to Mid LM lesion is 50% stenosed.  Mid LAD lesion is 80% stenosed.  Mid LAD to Dist LAD lesion is 90% stenosed.  Prox Cx to Mid Cx lesion is 95% stenosed.  Post intervention, there is a 0% residual stenosis.  A drug-eluting stent was successfully placed using a SYNERGY XD 3.0X24.  LV end diastolic pressure is mildly elevated.   1. Severe 3 vessel obstructive CAD    - Diffuse 80% mid LAD at site of prior stent. The mid to distal LAD is diffusely diseased up to 90%.     - 95% mid LCx. This is the culprit lesion    - Diffuse ectasia of the RCA with 50% stenoses. The PDA is diffusely diseased up to 90%  2. 2 Vein grafts are noted to be occluded at proximal aortic anastomosis.  3. The LIMA was never used as a graft and is a large vessel. 4. Mildly elevated LVEDP 5. Successful PCI of the LCx with DES x 1.  Plan: DAPT for at least a year and probably indefinitely given high disease burden and risk. The LAD and PDA disease is not amenable to intervention. Anticipate DC in am.  )         Anesthesia Quick Evaluation

## 2024-03-03 NOTE — Progress Notes (Signed)
 Anesthesia Chart Review:  68 year old female follows with cardiology for history of CAD s/p CABG 1998 and DES to left circumflex thousand 1, AAA, HTN, right bundle branch block, ischemic cardiomyopathy, HLD.  Echo 11/2023 with LVEF 50 to 55%, grade 1 DD, normal RV, no significant valvular abnormalities.  Lexiscan 09/2023 was nonischemic.  Last seen in follow-up by Delon Hoover, NP on 02/07/2024.  Noted to be stable at that time, no anginal symptoms, no indication for ischemic evaluation.  Current medications were continued.  He was also discussed to be following up with vascular surgery for endovascular repair of 6.1 cm AAA.  Other pertinent history includes former smoker (quit 1998), GERD on PPI.  Preop labs reviewed, WNL.  EKG 11/07/2023: Sinus rhythm with first-degree AV block.  Rate 66.  TTE 12/06/2023: 1. Left ventricular ejection fraction, by estimation, is 50 to 55%. The  left ventricle has low normal function. The left ventricle has no regional  wall motion abnormalities. Left ventricular diastolic parameters are  consistent with Grade I diastolic  dysfunction (impaired relaxation). The average left ventricular global  longitudinal strain is -14.3 %. The global longitudinal strain is  abnormal.   2. Right ventricular systolic function is normal. The right ventricular  size is normal. There is normal pulmonary artery systolic pressure.   3. Right atrial size was mildly dilated.   4. The mitral valve is normal in structure. No evidence of mitral valve  regurgitation. No evidence of mitral stenosis.   5. The aortic valve is normal in structure. Aortic valve regurgitation is  not visualized. No aortic stenosis is present.   6. The inferior vena cava is normal in size with greater than 50%  respiratory variability, suggesting right atrial pressure of 3 mmHg.   Nuclear stress 10/15/2023: Summary and conclusions: 1.  Very small area of ischemia involving midportion of the anterior lateral  wall. 2.  Normal gated images. 3.  Normal ejection fraction calculated of 75%. Comments: Fixed defect involving basal midportion inferior lateral wall most likely represents diaphragmatic attenuation.  Cath and PCI 07/31/2019:  Prox RCA lesion is 50% stenosed. Mid RCA lesion is 50% stenosed. RPDA lesion is 90% stenosed. Ost LM to Mid LM lesion is 50% stenosed. Mid LAD lesion is 80% stenosed. Mid LAD to Dist LAD lesion is 90% stenosed. Prox Cx to Mid Cx lesion is 95% stenosed. Post intervention, there is a 0% residual stenosis. A drug-eluting stent was successfully placed using a SYNERGY XD 3.0X24. LV end diastolic pressure is mildly elevated.   1. Severe 3 vessel obstructive CAD    - Diffuse 80% mid LAD at site of prior stent. The mid to distal LAD is diffusely diseased up to 90%.     - 95% mid LCx. This is the culprit lesion    - Diffuse ectasia of the RCA with 50% stenoses. The PDA is diffusely diseased up to 90%  2. 2 Vein grafts are noted to be occluded at proximal aortic anastomosis.  3. The LIMA was never used as a graft and is a large vessel. 4. Mildly elevated LVEDP 5. Successful PCI of the LCx with DES x 1.   Plan: DAPT for at least a year and probably indefinitely given high disease burden and risk. The LAD and PDA disease is not amenable to intervention. Anticipate DC in am.     Lynwood Geofm RIGGERS [facility]/Anesthesiology Phone [phone] 03/03/2024 2:25 PM

## 2024-03-05 ENCOUNTER — Inpatient Hospital Stay (HOSPITAL_COMMUNITY): Admitting: Anesthesiology

## 2024-03-05 ENCOUNTER — Encounter (HOSPITAL_COMMUNITY)
Admission: RE | Disposition: A | Discharge status: Home / Self Care | Payer: Self-pay | Source: Home / Self Care | Attending: Vascular Surgery

## 2024-03-05 ENCOUNTER — Inpatient Hospital Stay (HOSPITAL_COMMUNITY): Payer: Self-pay | Admitting: Physician Assistant

## 2024-03-05 ENCOUNTER — Inpatient Hospital Stay (HOSPITAL_COMMUNITY)

## 2024-03-05 ENCOUNTER — Encounter (HOSPITAL_COMMUNITY): Payer: Self-pay | Admitting: Vascular Surgery

## 2024-03-05 ENCOUNTER — Inpatient Hospital Stay (HOSPITAL_COMMUNITY)
Admission: RE | Admit: 2024-03-05 | Discharge: 2024-03-06 | DRG: 269 | Disposition: A | Discharge status: Home / Self Care | Attending: Vascular Surgery | Admitting: Vascular Surgery

## 2024-03-05 DIAGNOSIS — Z8679 Personal history of other diseases of the circulatory system: Secondary | ICD-10-CM

## 2024-03-05 DIAGNOSIS — K219 Gastro-esophageal reflux disease without esophagitis: Secondary | ICD-10-CM | POA: Diagnosis present

## 2024-03-05 DIAGNOSIS — I10 Essential (primary) hypertension: Secondary | ICD-10-CM | POA: Diagnosis present

## 2024-03-05 DIAGNOSIS — Z955 Presence of coronary angioplasty implant and graft: Secondary | ICD-10-CM | POA: Diagnosis not present

## 2024-03-05 DIAGNOSIS — I2510 Atherosclerotic heart disease of native coronary artery without angina pectoris: Secondary | ICD-10-CM

## 2024-03-05 DIAGNOSIS — Z951 Presence of aortocoronary bypass graft: Secondary | ICD-10-CM | POA: Diagnosis not present

## 2024-03-05 DIAGNOSIS — Z882 Allergy status to sulfonamides status: Secondary | ICD-10-CM

## 2024-03-05 DIAGNOSIS — I252 Old myocardial infarction: Secondary | ICD-10-CM

## 2024-03-05 DIAGNOSIS — E785 Hyperlipidemia, unspecified: Secondary | ICD-10-CM | POA: Diagnosis present

## 2024-03-05 DIAGNOSIS — Z79899 Other long term (current) drug therapy: Secondary | ICD-10-CM | POA: Diagnosis not present

## 2024-03-05 DIAGNOSIS — M797 Fibromyalgia: Secondary | ICD-10-CM | POA: Diagnosis present

## 2024-03-05 DIAGNOSIS — Z7982 Long term (current) use of aspirin: Secondary | ICD-10-CM | POA: Diagnosis not present

## 2024-03-05 DIAGNOSIS — Z87891 Personal history of nicotine dependence: Secondary | ICD-10-CM

## 2024-03-05 DIAGNOSIS — I255 Ischemic cardiomyopathy: Secondary | ICD-10-CM | POA: Diagnosis present

## 2024-03-05 DIAGNOSIS — Z888 Allergy status to other drugs, medicaments and biological substances status: Secondary | ICD-10-CM

## 2024-03-05 DIAGNOSIS — M170 Bilateral primary osteoarthritis of knee: Secondary | ICD-10-CM | POA: Diagnosis present

## 2024-03-05 DIAGNOSIS — Z95828 Presence of other vascular implants and grafts: Principal | ICD-10-CM

## 2024-03-05 DIAGNOSIS — I7143 Infrarenal abdominal aortic aneurysm, without rupture: Secondary | ICD-10-CM

## 2024-03-05 HISTORY — PX: ABDOMINAL AORTIC ENDOVASCULAR STENT GRAFT: SHX5707

## 2024-03-05 HISTORY — PX: ULTRASOUND GUIDANCE FOR VASCULAR ACCESS: SHX6516

## 2024-03-05 LAB — CBC
HCT: 37.2 % (ref 36.0–46.0)
Hemoglobin: 12.4 g/dL (ref 12.0–15.0)
MCH: 32.5 pg (ref 26.0–34.0)
MCHC: 33.3 g/dL (ref 30.0–36.0)
MCV: 97.6 fL (ref 80.0–100.0)
Platelets: 165 K/uL (ref 150–400)
RBC: 3.81 MIL/uL — ABNORMAL LOW (ref 3.87–5.11)
RDW: 12.7 % (ref 11.5–15.5)
WBC: 7.7 K/uL (ref 4.0–10.5)
nRBC: 0 % (ref 0.0–0.2)

## 2024-03-05 LAB — CREATININE, SERUM
Creatinine, Ser: 0.68 mg/dL (ref 0.44–1.00)
GFR, Estimated: >60 mL/min (ref >60)

## 2024-03-05 LAB — ABO/RH: ABO/RH(D): O POS

## 2024-03-05 SURGERY — INSERTION, ENDOVASCULAR STENT GRAFT, AORTA, ABDOMINAL
Anesthesia: General | Site: Groin | Laterality: Bilateral

## 2024-03-05 MED ORDER — FENTANYL CITRATE (PF) 250 MCG/5ML IJ SOLN
INTRAMUSCULAR | Status: DC | PRN
  Administered 2024-03-05: 50 ug via INTRAVENOUS
  Administered 2024-03-05: 100 ug via INTRAVENOUS

## 2024-03-05 MED ORDER — PROPOFOL 10 MG/ML IV BOLUS
INTRAVENOUS | Status: AC
  Filled 2024-03-05: qty 20

## 2024-03-05 MED ORDER — POTASSIUM CHLORIDE CRYS ER 20 MEQ PO TBCR: 40.0000 meq | EXTENDED_RELEASE_TABLET | Freq: Every day | ORAL | Status: DC | PRN

## 2024-03-05 MED ORDER — PHENYLEPHRINE HCL-NACL 20-0.9 MG/250ML-% IV SOLN
INTRAVENOUS | Status: AC
  Filled 2024-03-05: qty 250

## 2024-03-05 MED ORDER — CHLORHEXIDINE GLUCONATE CLOTH 2 % EX PADS: 6.0000 | MEDICATED_PAD | Freq: Once | CUTANEOUS | Status: DC

## 2024-03-05 MED ORDER — PHENYLEPHRINE HCL (PRESSORS) 10 MG/ML IV SOLN
INTRAVENOUS | Status: DC | PRN
  Administered 2024-03-05 (×2): 80 ug via INTRAVENOUS

## 2024-03-05 MED ORDER — PHENYLEPHRINE 80 MCG/ML (10ML) SYRINGE FOR IV PUSH (FOR BLOOD PRESSURE SUPPORT)
PREFILLED_SYRINGE | INTRAVENOUS | Status: AC
  Filled 2024-03-05: qty 10

## 2024-03-05 MED ORDER — SODIUM CHLORIDE 0.9 % IV SOLN: INTRAVENOUS | Status: DC

## 2024-03-05 MED ORDER — CEFAZOLIN SODIUM-DEXTROSE 2-4 GM/100ML-% IV SOLN
INTRAVENOUS | Status: AC
  Filled 2024-03-05: qty 100

## 2024-03-05 MED ORDER — HEPARIN 6000 UNIT IRRIGATION SOLUTION
Status: DC | PRN
  Administered 2024-03-05: 1

## 2024-03-05 MED ORDER — LIDOCAINE HCL (CARDIAC) PF 100 MG/5ML IV SOSY
PREFILLED_SYRINGE | INTRAVENOUS | Status: DC | PRN
  Administered 2024-03-05: 40 mg via INTRATRACHEAL

## 2024-03-05 MED ORDER — PROPOFOL 10 MG/ML IV BOLUS
INTRAVENOUS | Status: DC | PRN
  Administered 2024-03-05: 90 mg via INTRAVENOUS

## 2024-03-05 MED ORDER — ACETAMINOPHEN 500 MG PO TABS: 500.0000 mg | ORAL_TABLET | Freq: Four times a day (QID) | ORAL | Status: DC | PRN

## 2024-03-05 MED ORDER — MIDAZOLAM HCL 2 MG/2ML IJ SOLN
INTRAMUSCULAR | Status: AC
  Filled 2024-03-05: qty 2

## 2024-03-05 MED ORDER — FENTANYL CITRATE (PF) 100 MCG/2ML IJ SOLN: 25.0000 ug | INTRAMUSCULAR | Status: DC | PRN

## 2024-03-05 MED ORDER — ATORVASTATIN CALCIUM 80 MG PO TABS
80.0000 mg | ORAL_TABLET | Freq: Every day | ORAL | Status: DC
  Administered 2024-03-06: 80 mg via ORAL
  Filled 2024-03-05: qty 1

## 2024-03-05 MED ORDER — ACETAMINOPHEN 650 MG RE SUPP: 325.0000 mg | RECTAL | Status: DC | PRN

## 2024-03-05 MED ORDER — ONDANSETRON HCL 4 MG/2ML IJ SOLN
INTRAMUSCULAR | Status: DC | PRN
  Administered 2024-03-05: 4 mg via INTRAVENOUS

## 2024-03-05 MED ORDER — CHLORHEXIDINE GLUCONATE 0.12 % MT SOLN: 15.0000 mL | Freq: Once | OROMUCOSAL | Status: AC

## 2024-03-05 MED ORDER — PANTOPRAZOLE SODIUM 40 MG PO TBEC: 40.0000 mg | DELAYED_RELEASE_TABLET | Freq: Every day | ORAL | Status: DC

## 2024-03-05 MED ORDER — OXYCODONE-ACETAMINOPHEN 5-325 MG PO TABS: 1.0000 | ORAL_TABLET | ORAL | Status: DC | PRN

## 2024-03-05 MED ORDER — METOPROLOL SUCCINATE ER 50 MG PO TB24
50.0000 mg | ORAL_TABLET | Freq: Every day | ORAL | Status: DC
  Administered 2024-03-06: 50 mg via ORAL
  Filled 2024-03-05: qty 1

## 2024-03-05 MED ORDER — OXYCODONE HCL 5 MG PO TABS: 5.0000 mg | ORAL_TABLET | Freq: Once | ORAL | Status: DC | PRN

## 2024-03-05 MED ORDER — PHENYLEPHRINE 80 MCG/ML (10ML) SYRINGE FOR IV PUSH (FOR BLOOD PRESSURE SUPPORT)
PREFILLED_SYRINGE | INTRAVENOUS | Status: DC | PRN
  Administered 2024-03-05: 40 ug via INTRAVENOUS

## 2024-03-05 MED ORDER — LIDOCAINE 2% (20 MG/ML) 5 ML SYRINGE
INTRAMUSCULAR | Status: AC
  Filled 2024-03-05: qty 5

## 2024-03-05 MED ORDER — ROCURONIUM BROMIDE 10 MG/ML (PF) SYRINGE
PREFILLED_SYRINGE | INTRAVENOUS | Status: AC
Start: 2024-03-05 — End: 2024-03-05
  Filled 2024-03-05: qty 10

## 2024-03-05 MED ORDER — ROCURONIUM BROMIDE 100 MG/10ML IV SOLN
INTRAVENOUS | Status: DC | PRN
  Administered 2024-03-05: 70 mg via INTRAVENOUS

## 2024-03-05 MED ORDER — ISOSORBIDE MONONITRATE ER 60 MG PO TB24
60.0000 mg | ORAL_TABLET | Freq: Every day | ORAL | Status: DC
  Administered 2024-03-06: 60 mg via ORAL
  Filled 2024-03-05: qty 1

## 2024-03-05 MED ORDER — MORPHINE SULFATE (PF) 2 MG/ML IV SOLN: 2.0000 mg | INTRAVENOUS | Status: DC | PRN

## 2024-03-05 MED ORDER — FENTANYL CITRATE (PF) 250 MCG/5ML IJ SOLN
INTRAMUSCULAR | Status: AC
  Filled 2024-03-05: qty 5

## 2024-03-05 MED ORDER — SUGAMMADEX SODIUM 200 MG/2ML IV SOLN
INTRAVENOUS | Status: DC | PRN
  Administered 2024-03-05: 200 mg via INTRAVENOUS

## 2024-03-05 MED ORDER — CEFAZOLIN SODIUM-DEXTROSE 2-4 GM/100ML-% IV SOLN
2.0000 g | Freq: Three times a day (TID) | INTRAVENOUS | Status: AC
  Administered 2024-03-05 (×2): 2 g via INTRAVENOUS
  Filled 2024-03-05 (×2): qty 100

## 2024-03-05 MED ORDER — HEPARIN 6000 UNIT IRRIGATION SOLUTION
Status: AC
  Filled 2024-03-05: qty 500

## 2024-03-05 MED ORDER — IODIXANOL 320 MG/ML IV SOLN
INTRAVENOUS | Status: DC | PRN
  Administered 2024-03-05: 65 mL via INTRA_ARTERIAL

## 2024-03-05 MED ORDER — LOSARTAN POTASSIUM 25 MG PO TABS: 25.0000 mg | ORAL_TABLET | Freq: Every day | ORAL | Status: DC

## 2024-03-05 MED ORDER — PROTAMINE SULFATE 10 MG/ML IV SOLN
INTRAVENOUS | Status: DC | PRN
  Administered 2024-03-05: 50 mg via INTRAVENOUS

## 2024-03-05 MED ORDER — ACETAMINOPHEN 10 MG/ML IV SOLN: 1000.0000 mg | Freq: Once | INTRAVENOUS | Status: DC | PRN

## 2024-03-05 MED ORDER — PHENOL 1.4 % MT LIQD: 1.0000 | OROMUCOSAL | Status: DC | PRN

## 2024-03-05 MED ORDER — ASPIRIN 81 MG PO CHEW
81.0000 mg | CHEWABLE_TABLET | Freq: Every day | ORAL | Status: DC
  Administered 2024-03-06: 81 mg via ORAL
  Filled 2024-03-05: qty 1

## 2024-03-05 MED ORDER — DOCUSATE SODIUM 100 MG PO CAPS
100.0000 mg | ORAL_CAPSULE | Freq: Every day | ORAL | Status: DC
  Filled 2024-03-05: qty 1

## 2024-03-05 MED ORDER — ONDANSETRON HCL 4 MG/2ML IJ SOLN
INTRAMUSCULAR | Status: AC
  Filled 2024-03-05: qty 2

## 2024-03-05 MED ORDER — CHLORHEXIDINE GLUCONATE 0.12 % MT SOLN
OROMUCOSAL | Status: AC
  Administered 2024-03-05: 15 mL via OROMUCOSAL
  Filled 2024-03-05: qty 15

## 2024-03-05 MED ORDER — ORAL CARE MOUTH RINSE: 15.0000 mL | Freq: Once | OROMUCOSAL | Status: AC

## 2024-03-05 MED ORDER — DEXAMETHASONE SOD PHOSPHATE PF 10 MG/ML IJ SOLN
INTRAMUSCULAR | Status: DC | PRN
  Administered 2024-03-05: 10 mg via INTRAVENOUS

## 2024-03-05 MED ORDER — HEPARIN SODIUM (PORCINE) 5000 UNIT/ML IJ SOLN
5000.0000 [IU] | Freq: Two times a day (BID) | INTRAMUSCULAR | Status: DC
  Filled 2024-03-05: qty 1

## 2024-03-05 MED ORDER — SODIUM CHLORIDE 0.9 % IV SOLN: INTRAVENOUS | Status: DC | PRN

## 2024-03-05 MED ORDER — DEXMEDETOMIDINE HCL IN NACL 80 MCG/20ML IV SOLN
INTRAVENOUS | Status: AC
  Filled 2024-03-05: qty 20

## 2024-03-05 MED ORDER — PANTOPRAZOLE SODIUM 40 MG PO TBEC
40.0000 mg | DELAYED_RELEASE_TABLET | Freq: Every day | ORAL | Status: DC
  Administered 2024-03-06: 40 mg via ORAL
  Filled 2024-03-05: qty 1

## 2024-03-05 MED ORDER — CEFAZOLIN SODIUM-DEXTROSE 2-4 GM/100ML-% IV SOLN
2.0000 g | INTRAVENOUS | Status: AC
Start: 2024-03-05 — End: 2024-03-05
  Administered 2024-03-05: 2 g via INTRAVENOUS

## 2024-03-05 MED ORDER — POLYETHYLENE GLYCOL 3350 17 G PO PACK: 17.0000 g | PACK | Freq: Every day | ORAL | Status: DC | PRN

## 2024-03-05 MED ORDER — OXYCODONE HCL 5 MG/5ML PO SOLN: 5.0000 mg | Freq: Once | ORAL | Status: DC | PRN

## 2024-03-05 MED ORDER — LACTATED RINGERS IV SOLN: INTRAVENOUS | Status: DC

## 2024-03-05 MED ORDER — ACETAMINOPHEN 325 MG PO TABS: 325.0000 mg | ORAL_TABLET | ORAL | Status: DC | PRN

## 2024-03-05 MED ORDER — HEPARIN SODIUM (PORCINE) 1000 UNIT/ML IJ SOLN
INTRAMUSCULAR | Status: DC | PRN
  Administered 2024-03-05: 8000 [IU] via INTRAVENOUS

## 2024-03-05 MED ORDER — METOPROLOL TARTRATE 5 MG/5ML IV SOLN: 2.5000 mg | INTRAVENOUS | Status: DC | PRN

## 2024-03-05 MED ORDER — LACTATED RINGERS IV SOLN: INTRAVENOUS | Status: DC | PRN

## 2024-03-05 MED ORDER — NITROGLYCERIN 0.4 MG SL SUBL: 0.4000 mg | SUBLINGUAL_TABLET | SUBLINGUAL | Status: DC | PRN

## 2024-03-05 MED ORDER — PHENYLEPHRINE HCL-NACL 20-0.9 MG/250ML-% IV SOLN
INTRAVENOUS | Status: DC | PRN
  Administered 2024-03-05: 50 ug/min via INTRAVENOUS

## 2024-03-05 SURGICAL SUPPLY — 41 items
BAG COUNTER SPONGE SURGICOUNT (BAG) ×1 IMPLANT
BENZOIN TINCTURE PRP APPL 2/3 (GAUZE/BANDAGES/DRESSINGS) ×1 IMPLANT
BLADE CLIPPER SURG (BLADE) ×1 IMPLANT
CANISTER SUCTION 3000ML PPV (SUCTIONS) ×1 IMPLANT
CATH BEACON 5.038 65CM KMP-01 (CATHETERS) ×1 IMPLANT
CATH OMNI FLUSH .035X70CM (CATHETERS) ×1 IMPLANT
CATH OMNI FLUSH 5F 65CM (CATHETERS) IMPLANT
CHLORAPREP W/TINT 26 (MISCELLANEOUS) ×1 IMPLANT
CLSR STERI-STRIP ANTIMIC 1/2X4 (GAUZE/BANDAGES/DRESSINGS) IMPLANT
DERMABOND ADVANCED .7 DNX12 (GAUZE/BANDAGES/DRESSINGS) IMPLANT
DRSG IV TEGADERM 3.5X4.5 STRL (GAUZE/BANDAGES/DRESSINGS) IMPLANT
DRSG TEGADERM 2-3/8X2-3/4 SM (GAUZE/BANDAGES/DRESSINGS) ×2 IMPLANT
ELECTRODE REM PT RTRN 9FT ADLT (ELECTROSURGICAL) ×2 IMPLANT
EXCLUDER TNK END 32X14.5X14 18 (Endovascular Graft) IMPLANT
GAUZE SPONGE 2X2 8PLY STRL LF (GAUZE/BANDAGES/DRESSINGS) ×2 IMPLANT
GLOVE BIO SURGEON STRL SZ8 (GLOVE) ×1 IMPLANT
GOWN STRL REUS W/ TWL LRG LVL3 (GOWN DISPOSABLE) ×2 IMPLANT
GOWN STRL REUS W/ TWL XL LVL3 (GOWN DISPOSABLE) ×1 IMPLANT
GRAFT BALLN CATH 65CM (BALLOONS) ×1 IMPLANT
KIT BASIN OR (CUSTOM PROCEDURE TRAY) ×1 IMPLANT
KIT TURNOVER KIT B (KITS) ×1 IMPLANT
LEG CONTRALATERAL 16X14.5X10 (Vascular Products) IMPLANT
PACK ENDOVASCULAR (PACKS) ×1 IMPLANT
PAD ARMBOARD POSITIONER FOAM (MISCELLANEOUS) ×2 IMPLANT
SET MICROPUNCTURE 5F STIFF (MISCELLANEOUS) ×1 IMPLANT
SHEATH BRITE TIP 8FR 23CM (SHEATH) ×1 IMPLANT
SHEATH DRYSEAL FLEX 12FR 33CM (SHEATH) IMPLANT
SHEATH DRYSEAL FLEX 18FR 33CM (SHEATH) IMPLANT
SHEATH PINNACLE 8F 10CM (SHEATH) ×1 IMPLANT
SOLN 0.9% NACL 1000 ML (IV SOLUTION) ×1 IMPLANT
SOLN 0.9% NACL POUR BTL 1000ML (IV SOLUTION) ×1 IMPLANT
STENT GRAFT CONTRALAT 16X11.5 (Endovascular Graft) IMPLANT
STOPCOCK MORSE 400PSI 3WAY (MISCELLANEOUS) ×1 IMPLANT
STRIP CLOSURE SKIN 1/2X4 (GAUZE/BANDAGES/DRESSINGS) ×2 IMPLANT
SUT MNCRL AB 4-0 PS2 18 (SUTURE) ×2 IMPLANT
SYR 20CC LL (SYRINGE) ×2 IMPLANT
TOWEL GREEN STERILE (TOWEL DISPOSABLE) ×1 IMPLANT
TRAY FOLEY MTR SLVR 14FR STAT (SET/KITS/TRAYS/PACK) IMPLANT
TUBING INJECTOR 48 (MISCELLANEOUS) ×1 IMPLANT
WIRE BENTSON .035X145CM (WIRE) ×2 IMPLANT
WIRE LUNDERQUIST .035X180CM (WIRE) ×2 IMPLANT

## 2024-03-05 NOTE — Transfer of Care (Signed)
 Immediate Anesthesia Transfer of Care Note  Patient: Sharon Saunders  Procedure(s) Performed: INSERTION OF ABDOMINAL AORTA ENDOVASCULAR STENT GRAFT (Bilateral: Groin) ULTRASOUND GUIDANCE, FOR VASCULAR ACCESS (Bilateral: Groin)  Patient Location: PACU  Anesthesia Type:General  Level of Consciousness: awake, alert , and oriented  Airway & Oxygen Therapy: Patient Spontanous Breathing and Patient connected to nasal cannula oxygen  Post-op Assessment: Report given to RN and Post -op Vital signs reviewed and stable  Post vital signs: Reviewed and stable  Last Vitals:  Vitals Value Taken Time  BP 105/63 03/05/24 12:03  Temp 36.6 C 03/05/24 12:03  Pulse 66 03/05/24 12:07  Resp 7 03/05/24 12:07  SpO2 100 % 03/05/24 12:07  Vitals shown include unfiled device data.  Last Pain:  Vitals:   03/05/24 0844  TempSrc:   PainSc: 0-No pain         Complications: No notable events documented.

## 2024-03-05 NOTE — Anesthesia Procedure Notes (Signed)
 Procedure Name: Intubation Date/Time: 03/05/2024 10:20 AM  Performed by: Obadiah Reyes BROCKS, CRNAPre-anesthesia Checklist: Patient identified, Emergency Drugs available, Suction available and Patient being monitored Patient Re-evaluated:Patient Re-evaluated prior to induction Oxygen Delivery Method: Circle System Utilized Preoxygenation: Pre-oxygenation with 100% oxygen Induction Type: IV induction Ventilation: Mask ventilation without difficulty Laryngoscope Size: Miller and 2 Grade View: Grade I Tube type: Oral Tube size: 7.0 mm Number of attempts: 1 Airway Equipment and Method: Stylet and Oral airway Placement Confirmation: ETT inserted through vocal cords under direct vision, positive ETCO2 and breath sounds checked- equal and bilateral Secured at: 20 cm Tube secured with: Tape Dental Injury: Teeth and Oropharynx as per pre-operative assessment

## 2024-03-05 NOTE — Anesthesia Postprocedure Evaluation (Signed)
 Anesthesia Post Note  Patient: Sharon Saunders  Procedure(s) Performed: INSERTION OF ABDOMINAL AORTA ENDOVASCULAR STENT GRAFT (Bilateral: Groin) ULTRASOUND GUIDANCE, FOR VASCULAR ACCESS (Bilateral: Groin)     Patient location during evaluation: PACU Anesthesia Type: General Level of consciousness: awake and alert Pain management: pain level controlled Vital Signs Assessment: post-procedure vital signs reviewed and stable Respiratory status: spontaneous breathing, nonlabored ventilation and respiratory function stable Cardiovascular status: blood pressure returned to baseline and stable Postop Assessment: no apparent nausea or vomiting Anesthetic complications: no   No notable events documented.  Last Vitals:  Vitals:   03/05/24 1300 03/05/24 1325  BP: 104/60 98/60  Pulse: (!) 58 60  Resp: 14 12  Temp: 36.6 C 36.7 C  SpO2: 97% 98%    Last Pain:  Vitals:   03/05/24 1325  TempSrc: Oral  PainSc:                  Patrcia Schnepp

## 2024-03-05 NOTE — Op Note (Signed)
 DATE OF SERVICE: 03/05/2024  PATIENT:  Sharon Saunders  68 y.o. female  PRE-OPERATIVE DIAGNOSIS:  60mm infrarenal abdominal aortic aneurysm without rupture  POST-OPERATIVE DIAGNOSIS:  Same  PROCEDURE:   1) bilateral ultrasound guided common femoral artery access and large bore closure (CPT 34713) 2) placement of aorto-bi-iliac endograft (CPT [phone]) 3) extension of endovascular repair (CPT [phone])  SURGEON:  Surgeons and Role:    * [name] Debby SAILOR, MD - Primary  ASSISTANT: Adina Sender, PA-C  An experienced assistant was required given the complexity of this procedure and the standard of surgical care. My assistant helped with exposure through counter tension, suctioning, ligation and retraction to better visualize the surgical field.  My assistant expedited sewing during the case by following my sutures. Wherever I use the term we in the report, my assistant actively helped me with that portion of the procedure.  ANESTHESIA:   general  EBL: 50mL  BLOOD ADMINISTERED:none  DRAINS: none   LOCAL MEDICATIONS USED:  NONE  SPECIMEN:  none  COUNTS: confirmed correct.  TOURNIQUET:  none  PATIENT DISPOSITION:  PACU - hemodynamically stable.   Delay start of Pharmacological VTE agent (>24hrs) due to surgical blood loss or risk of bleeding: no  INDICATION FOR PROCEDURE: Sharon Saunders is a 68 y.o. female with 60mm infrarenal abdominal aortic aneurysm. After careful discussion of risks, benefits, and alternatives the patient was offered EVAR. The patient understood and wished to proceed.  OPERATIVE FINDINGS: successful EVAR. No evidence of endoleak at completion. Catheter fracture during case with all components removed.  DESCRIPTION OF PROCEDURE: After identification of the patient in the pre-operative holding area, the patient was transferred to the operating room. The patient was positioned supine on the operating room table. Anesthesia was induced. The abdomen and bilateral  groins were prepped and draped in standard fashion. A surgical pause was performed confirming correct patient, procedure, and operative location.  Using duplex ultrasound guidance, the bilateral common femoral arteries were cannulated with micropuncture technique.  Small incision was made around the micro sheath after introduction to the common femoral artery to accommodate large-bore access.  The tract was dilated with an 8 French sheath dilator.  Perclose sutures were placed at 10:00 and 2:00 on the arteriotomy and secured with shod clamps were used at the end of the case.  Access was upsized to 8 [place] bilaterally.  Bentson wires were navigated into the descending thoracic aorta.  Kumpe catheter was advanced over the wire into descending thoracic aorta.  Wires were exchanged for Lunderquist wires bilaterally.  18 French dry seal sheath was introduced into the left common femoral artery access.  12 French dry seal sheath was introduced to the right common femoral artery access.  The patient was systemically heparinized.  Activated clotting time measurements were used throughout the case to confirm adequate anticoagulation.  A 32 x 14 x 140 mm Gore excluder endoprosthesis was delivered via the left common femoral artery into the anticipated position of the renal origins.  Via the right common femoral artery access, Omni Flush catheter was advanced to L1.  Angiogram was performed and the renal anatomy was marked.  The excluder was partially deployed at the level of the left renal artery.  The graft did fall away from the right renal artery a bit, but there is still a long segment of seal zone, and so we left this where it was.  The gate deployed well.  From the right common femoral artery the gate was cannulated  with a Bentson wire and a KMP catheter.  Omni Flush catheter was spun in the main body of the endoprosthesis to confirm accurate cannulation.  Retrograde angiogram was performed from the right  common femoral artery access to identify the hypogastric artery takeoff.  A 16 x 115 mm iliac extension limb was deployed to the level of the distal common iliac artery.  The main body deployment was completed in a retrograde angiogram performed from the left common femoral artery access to define the hypogastric artery origin.  A 14 x 100 mm Gore iliac extension limb was deployed to the distal common iliac artery.  All areas of seal and overlap were angioplastied with a molding and occlusion balloon.  An Omni Flush catheter was advanced above the origin of the renal arteries and a completion angiogram performed.  This showed no evidence of endoleak.  There was adequate seal proximally.  There is adequate seal distally.  Satisfied, we ended the intervention here.  All endovascular equipment was removed.  The previously placed Perclose sutures were secured with the knot pusher.  Good hemostasis was achieved.  Good Doppler flow was confirmed in the feet.  Heparin  was reversed with protamine.  The wounds were closed with interrupted Monocryl sutures.  Upon completion of the case instrument and sharps counts were confirmed correct. The patient was transferred to the PACU in good condition. I was present for all portions of the procedure.  FOLLOW UP PLAN: Assuming a normal postoperative course, I will see the patient in 4 weeks with CT angiogram of the abdomen and pelvis.   Debby SAILOR. [name], MD [facility] Phone Number: [phone] 03/05/2024 11:51 AM

## 2024-03-05 NOTE — Interval H&P Note (Signed)
 History and Physical Interval Note:  03/05/2024 8:59 AM  Sharon Saunders  has presented today for surgery, with the diagnosis of AAA W/O RUPTURE.  The various methods of treatment have been discussed with the patient and family. After consideration of risks, benefits and other options for treatment, the patient has consented to  Procedure(s): INSERTION, ENDOVASCULAR STENT GRAFT, AORTA, ABDOMINAL (N/A) as a surgical intervention.  The patient's history has been reviewed, patient examined, no change in status, stable for surgery.  I have reviewed the patient's chart and labs.  Questions were answered to the patient's satisfaction.     Debby LOISE Robertson

## 2024-03-06 ENCOUNTER — Encounter (HOSPITAL_COMMUNITY): Payer: Self-pay | Admitting: Vascular Surgery

## 2024-03-06 LAB — POCT ACTIVATED CLOTTING TIME: Activated Clotting Time: 227 s

## 2024-03-06 LAB — BASIC METABOLIC PANEL WITH GFR
Anion gap: 11 (ref 5–15)
BUN: 7 mg/dL — ABNORMAL LOW (ref 8–23)
CO2: 25 mmol/L (ref 22–32)
Calcium: 8.9 mg/dL (ref 8.9–10.3)
Chloride: 103 mmol/L (ref 98–111)
Creatinine, Ser: 0.77 mg/dL (ref 0.44–1.00)
GFR, Estimated: >60 mL/min (ref >60)
Glucose, Bld: 111 mg/dL — ABNORMAL HIGH (ref 70–99)
Potassium: 3.8 mmol/L (ref 3.5–5.1)
Sodium: 139 mmol/L (ref 135–145)

## 2024-03-06 NOTE — Discharge Instructions (Signed)

## 2024-03-06 NOTE — Discharge Summary (Signed)
 EVAR Discharge Summary   Sharon Saunders 22-Feb-1956 68 y.o. female  MRN: [id number]  Admission Date: 03/05/2024  Discharge Date: 03/06/2024  Physician: Debby Robertson, MD  Admission Diagnosis: Infrarenal abdominal aortic aneurysm (AAA) without rupture [I71.43] S/P insertion of endovascular thoracic aortic stent graft [Z95.828] S/P AAA repair [S01.109, Z86.79]  Discharge Day Diagnosis: Infrarenal abdominal aortic aneurysm (AAA) without rupture [I71.43] S/P insertion of endovascular thoracic aortic stent graft [Z95.828] S/P AAA repair [S01.109, Z86.79]  Hospital Course:  The patient was admitted to the hospital and taken to the operating room on 03/05/2024 and underwent: EVAR    The pt tolerated the procedure well and was transported to the PACU in excellent condition.   On POD1 she was doing well without any pain. Bilateral groin access sites were soft without hematoma. BLE were well perfused with palpable DP pulses.  The remainder of the hospital course consisted of increasing mobilization and increasing intake of solids without difficulty.  She was discharged home on POD1.  CBC    Component Value Date/Time   WBC 7.7 03/05/2024 1357   RBC 3.81 (L) 03/05/2024 1357   HGB 12.4 03/05/2024 1357   HGB 13.2 06/10/2023 1111   HCT 37.2 03/05/2024 1357   HCT 40.7 06/10/2023 1111   PLT 165 03/05/2024 1357   PLT 237 06/10/2023 1111   MCV 97.6 03/05/2024 1357   MCV 96 06/10/2023 1111   MCH 32.5 03/05/2024 1357   MCHC 33.3 03/05/2024 1357   RDW 12.7 03/05/2024 1357   RDW 12.2 06/10/2023 1111   LYMPHSABS 2.0 06/10/2023 1111   EOSABS 0.1 06/10/2023 1111   BASOSABS 0.1 06/10/2023 1111    BMET    Component Value Date/Time   NA 139 03/06/2024 0430   NA 141 06/10/2023 1111   K 3.8 03/06/2024 0430   CL 103 03/06/2024 0430   CO2 25 03/06/2024 0430   GLUCOSE 111 (H) 03/06/2024 0430   BUN 7 (L) 03/06/2024 0430   BUN 8 06/10/2023 1111   CREATININE 0.77 03/06/2024 0430    CALCIUM  8.9 03/06/2024 0430   GFRNONAA >60 03/06/2024 0430   GFRAA 108 02/25/2020 0000       Discharge Instructions     Call MD for:  redness, tenderness, or signs of infection (pain, swelling, redness, odor or green/yellow discharge around incision site)   Complete by: As directed    Call MD for:  severe uncontrolled pain   Complete by: As directed    Call MD for:  temperature >100.4   Complete by: As directed    Diet - low sodium heart healthy   Complete by: As directed    Increase activity slowly   Complete by: As directed    Remove dressing in 24 hours   Complete by: As directed        Discharge Diagnosis:  Infrarenal abdominal aortic aneurysm (AAA) without rupture [I71.43] S/P insertion of endovascular thoracic aortic stent graft [Z95.828] S/P AAA repair [S01.109, Z86.79]  Secondary Diagnosis: Patient Active Problem List   Diagnosis Date Noted   S/P insertion of endovascular thoracic aortic stent graft 03/05/2024   S/P AAA repair 03/05/2024   AAA (abdominal aortic aneurysm) without rupture    Arthritis    Hyperlipidemia    Status post coronary artery bypass graft 08/28/2019   NSTEMI (non-ST elevated myocardial infarction) (HCC) 07/30/2019   Hyperlipidemia LDL goal <70 07/30/2019   Ischemic cardiomyopathy    Obesity    STEMI (ST elevation myocardial infarction) (HCC) 12/28/2016  ACS (acute coronary syndrome) (HCC) 12/28/2016   Hypertension    CAD (coronary artery disease)    RBBB    Fibromyalgia    Past Medical History:  Diagnosis Date   AAA (abdominal aortic aneurysm) without rupture    ACS (acute coronary syndrome) (HCC) 12/28/2016   Arthritis    osteoarthritis knees   CAD (coronary artery disease)    a. s/p CABG in 1998;  b. s/p prior LAD stenting;  c. 12/2016 Cath: LM 20/50, LAD 50p, diffuse 23m ISR, 80d, 90apical, D1 100, LCX min irregs, OM3 30/40, RCA 40p, 53m, 40d, RPDA 50. EF 40-45%, mid antlat HK, focal apical aneurysmal segment.    Fibromyalgia    GERD (gastroesophageal reflux disease)    Hyperlipidemia    Hyperlipidemia LDL goal <70 07/30/2019   Hypertension    Ischemic cardiomyopathy    a. 12/2016 LV Gram: EF 40-45% w/ moderate mid anterolateral HK and a focal apical aneurysmal segment.   NSTEMI (non-ST elevated myocardial infarction) (HCC) 07/30/2019   Obesity    RBBB    Status post coronary artery bypass graft 08/28/2019   STEMI (ST elevation myocardial infarction) (HCC) 12/28/2016     Allergies as of 03/06/2024       Reactions   Phenergan [promethazine] Anxiety, Rash, Other (See Comments)   Received a shot an an E.D.and immediately became very jittery   Quinolones    6.1 cm infrarenal AAA found May 2025.   Sulfa Antibiotics Rash, Dermatitis        Medication List     TAKE these medications    acetaminophen  500 MG tablet Commonly known as: TYLENOL  Take 500-1,000 mg by mouth every 6 (six) hours as needed for mild pain or moderate pain (for headaches or pain).   aspirin  81 MG chewable tablet Chew 1 tablet (81 mg total) by mouth daily.   atorvastatin  80 MG tablet Commonly known as: LIPITOR  Take 1 tablet by mouth once daily   isosorbide  mononitrate 60 MG 24 hr tablet Commonly known as: IMDUR  Take 1 tablet (60 mg total) by mouth daily.   losartan  25 MG tablet Commonly known as: COZAAR  Take 1 tablet by mouth once daily   metoprolol  succinate 50 MG 24 hr tablet Commonly known as: TOPROL -XL Take 1 tablet (50 mg total) by mouth daily.   nitroGLYCERIN  0.4 MG SL tablet Commonly known as: Nitrostat  Place 1 tablet (0.4 mg total) under the tongue every 5 (five) minutes as needed for chest pain.   pantoprazole  40 MG tablet Commonly known as: PROTONIX  Take 1 tablet (40 mg total) by mouth daily.        Discharge Instructions:   [facility]  Discharge Instructions Endovascular Aortic Aneurysm Repair  Please refer to the following instructions for your  post-procedure care. Your surgeon or Physician Assistant will discuss any changes with you.  Activity  You are encouraged to walk as much as you can. You can slowly return to normal activities but must avoid strenuous activity and heavy lifting until your doctor tells you it's OK. Avoid activities such as vacuuming or swinging a gold club. It is normal to feel tired for several weeks after your surgery. Do not drive until your doctor gives the OK and you are no longer taking prescription pain medications. It is also normal to have difficulty with sleep habits, eating, and bowel movements after surgery. These will go away with time.  Bathing/Showering  You may shower after you go home. If you  have an incision, do not soak in a bathtub, hot tub, or swim until the incision heals completely.  Incision Care  Shower every day. Clean your incision with mild soap and water. Pat the area dry with a clean towel. You do not need a bandage unless otherwise instructed. Do not apply any ointments or creams to your incision. If you clothing is irritating, you may cover your incision with a dry gauze pad.  Diet  Resume your normal diet. There are no special food restrictions following this procedure. A low fat/low cholesterol diet is recommended for all patients with vascular disease. In order to heal from your surgery, it is CRITICAL to get adequate nutrition. Your body requires vitamins, minerals, and protein. Vegetables are the best source of vitamins and minerals. Vegetables also provide the perfect balance of protein. Processed food has little nutritional value, so try to avoid this.  Medications  Resume taking all of your medications unless your doctor or Physician Assistant tells you not to. If your incision is causing pain, you may take over-the-counter pain relievers such as acetaminophen  (Tylenol ). If you were prescribed a stronger pain medication, please be aware these medications can cause nausea and  constipation. Prevent nausea by taking the medication with a snack or meal. Avoid constipation by drinking plenty of fluids and eating foods with a high amount of fiber, such as fruits, vegetables, and grains. Do not take Tylenol  if you are taking prescription pain medications.   Follow up  Our office will schedule a follow-up appointment with a C.T. scan 3-4 weeks after your surgery.  Please call us  immediately for any of the following conditions  Severe or worsening pain in your legs or feet or in your abdomen back or chest. Increased pain, redness, drainage (pus) from your incision sit. Increased abdominal pain, bloating, nausea, vomiting or persistent diarrhea. Fever of 101 degrees or higher. Swelling in your leg (s),  Reduce your risk of vascular disease  Stop smoking. If you would like help call [facility] at 1-800-QUIT-NOW (210-218-3679) or [place] at 757-129-6298. Manage your cholesterol Maintain a desired weight Control your diabetes Keep your blood pressure down  If you have questions, please call the office at [phone].   Disposition: Home  Patient's condition: is Excellent  Follow up: 1. Dr. [name] in 4 weeks with CTA protocol   Ahmed Holster, PA-C [facility] [phone] 03/06/2024  12:33 PM   - For VQI Registry use - Post-op:  Time to Extubation: [x]  In OR, [ ]  < 12 hrs, [ ]  12-24 hrs, [ ]  >=24 hrs Vasopressors Req. Post-op: No MI: No., [ ]  Troponin only, [ ]  EKG or Clinical New Arrhythmia: No CHF: No ICU Stay: 0 days Transfusion: No     If yes,  units given  Complications: Resp failure: No., [ ]  Pneumonia, [ ]  Ventilator Chg in renal function: No., [ ]  Inc. Cr > 0.5, [ ]  Temp. Dialysis,  [ ]  Permanent dialysis Leg ischemia: No., no Surgery needed, [ ]  Yes, Surgery needed,  [ ]  Amputation Bowel ischemia: No., [ ]  Medical Rx, [ ]  Surgical Rx Wound complication: No., [ ]  Superficial separation/infection, [ ]  Return to  OR Return to OR: No  Return to OR for bleeding: No Stroke: No., [ ]  Minor, [ ]  Major  Discharge medications: Statin use:  Yes  ASA use:  Yes  Plavix  use:  No  Beta blocker use:  Yes  ARB use:  No ACEI use:  No  CCB use:  No

## 2024-03-06 NOTE — Progress Notes (Signed)
 DISCHARGE NOTE Sharon Saunders to be discharged Sharon per MD order. Discussed prescriptions and follow up appointments with the patient. Prescriptions given to patient; medication list explained in detail. Patient verbalized understanding.  Skin clean, dry and intact without evidence of skin break down, no evidence of skin tears noted. IV catheter discontinued intact. Site without signs and symptoms of complications. Dressing and pressure applied. Pt denies pain at the site currently. No complaints noted.  Patient free of lines, drains, and wounds.   An After Visit Summary (AVS) was printed and given to the patient. Patient escorted via wheelchair, and discharged Sharon via private auto.  Peyton SHAUNNA Pepper, RN

## 2024-03-06 NOTE — Progress Notes (Signed)
  Progress Note    03/06/2024 7:50 AM 1 Day Post-Op  Subjective: No complaints, denies any pain    Vitals:   03/06/24 0531 03/06/24 0710  BP: 101/67 112/67  Pulse: 66 66  Resp: 18 16  Temp:  98.1 F (36.7 C)  SpO2: 97% 99%    Physical Exam: General: Sitting up in bed, no acute distress Cardiac: Regular Lungs: Nonlabored Incisions: Bilateral groin access sites soft without hematoma Extremities: Palpable DP pulses bilaterally Abdomen: Soft, nontender, nondistended  CBC    Component Value Date/Time   WBC 7.7 03/05/2024 1357   RBC 3.81 (L) 03/05/2024 1357   HGB 12.4 03/05/2024 1357   HGB 13.2 06/10/2023 1111   HCT 37.2 03/05/2024 1357   HCT 40.7 06/10/2023 1111   PLT 165 03/05/2024 1357   PLT 237 06/10/2023 1111   MCV 97.6 03/05/2024 1357   MCV 96 06/10/2023 1111   MCH 32.5 03/05/2024 1357   MCHC 33.3 03/05/2024 1357   RDW 12.7 03/05/2024 1357   RDW 12.2 06/10/2023 1111   LYMPHSABS 2.0 06/10/2023 1111   EOSABS 0.1 06/10/2023 1111   BASOSABS 0.1 06/10/2023 1111    BMET    Component Value Date/Time   NA 139 03/06/2024 0430   NA 141 06/10/2023 1111   K 3.8 03/06/2024 0430   CL 103 03/06/2024 0430   CO2 25 03/06/2024 0430   GLUCOSE 111 (H) 03/06/2024 0430   BUN 7 (L) 03/06/2024 0430   BUN 8 06/10/2023 1111   CREATININE 0.77 03/06/2024 0430   CALCIUM  8.9 03/06/2024 0430   GFRNONAA >60 03/06/2024 0430   GFRAA 108 02/25/2020 0000    INR    Component Value Date/Time   INR 1.1 03/02/2024 1000     Intake/Output Summary (Last 24 hours) at 03/06/2024 0750 Last data filed at 03/06/2024 0631 Gross per 24 hour  Intake 1470 ml  Output 2551 ml  Net -1081 ml      Assessment/Plan:  68 y.o. female is 1 day postop, s/p: EVAR   - She is doing well today without any complaints.  She denies any abdominal pain or pain at her access sites -She has gotten up out of bed several times to go to the bathroom without any issue.  She is also voiding without  issue -Bilateral groin access sites are soft without hematoma -Her abdomen is soft and nontender.  Her lower extremities are well-perfused with palpable DP pulses -Serum creatinine stable at 0.77 -She remains hemodynamically stable.  Will plan for discharge home today with follow-up with Dr. [name] in 1 month with CTA abdomen/pelvis -Continue aspirin  and statin   Xian Apostol, PA-C [facility] [phone] 03/06/2024 7:50 AM

## 2024-03-18 ENCOUNTER — Other Ambulatory Visit: Payer: Self-pay

## 2024-03-18 DIAGNOSIS — I7143 Infrarenal abdominal aortic aneurysm, without rupture: Secondary | ICD-10-CM

## 2024-03-29 ENCOUNTER — Other Ambulatory Visit: Payer: Self-pay | Admitting: Cardiology

## 2024-04-03 ENCOUNTER — Telehealth: Payer: Self-pay | Admitting: Cardiology

## 2024-04-03 NOTE — Telephone Encounter (Signed)
 Spoke with Sharon Saunders per DPR who states that her mom has been taking double dose of Losartan . Reports that her mom was stumbling and lightheaded at times. Medication list reviewed and advised that she might want to find a pharmacy that pill pack Sharon Saunders) Sharon Saunders verbalized understanding and had no additional questions.

## 2024-04-03 NOTE — Telephone Encounter (Signed)
 Pt c/o medication issue:  1. Name of Medication:   losartan  (COZAAR ) 25 MG tablet   2. How are you currently taking this medication (dosage and times per day)?   3. Are you having a reaction (difficulty breathing--STAT)?   4. What is your medication issue?   Daughter Luetta) stated patient may not be taking this medication as prescribed and wants a call back to confirm all patient's medication instructions.

## 2024-04-15 ENCOUNTER — Ambulatory Visit (INDEPENDENT_AMBULATORY_CARE_PROVIDER_SITE_OTHER)
Admission: RE | Admit: 2024-04-15 | Discharge: 2024-04-15 | Disposition: A | Discharge status: Home / Self Care | Source: Ambulatory Visit | Attending: Vascular Surgery | Admitting: Vascular Surgery

## 2024-04-15 DIAGNOSIS — I7143 Infrarenal abdominal aortic aneurysm, without rupture: Secondary | ICD-10-CM

## 2024-04-15 MED ORDER — IOHEXOL 350 MG/ML SOLN
100.0000 mL | Freq: Once | INTRAVENOUS | Status: AC | PRN
  Administered 2024-04-15: 100 mL via INTRAVENOUS

## 2024-04-19 ENCOUNTER — Other Ambulatory Visit: Payer: Self-pay | Admitting: Cardiology

## 2024-04-20 NOTE — Progress Notes (Deleted)
 [facility] [place]  ASSESSMENT / PLAN: Sharon Saunders is a 68 y.o. female status post EVAR for infrarenal aneurysm on 03/05/2024.  Recommend:  Abstinence from all tobacco products. Blood glucose control with goal A1c < 7%. Blood pressure control with goal blood pressure < 130/80 mmHg. Lipid reduction therapy with goal LDL-C < 55 mg/dL. Aspirin  81mg  by mouth daily. Atorvastatin  40-80mg  PO QD (or other high intensity statin therapy).  Good result seen on CT angiogram.  Follow-up with [facility] PA in 1 year with abdominal aortic duplex  CHIEF COMPLAINT: Aortic aneurysm  HISTORY OF PRESENT ILLNESS: Sharon Saunders is a 68 y.o. female referred to clinic for evaluation of infrarenal abdominal aortic aneurysm identified on outside duplex.  The patient is a resident of [place], [place] .  She is here with her daughter today.  We reviewed the duplex in detail.  We reviewed the natural history of abdominal aortic aneurysm disease in detail.  I counseled her about her low but real risk of rupture.  I counseled her about the 2 main modes of aortic reconstruction for aneurysm disease.   02/11/24. Patient returns to review her CT scan. She is doing well overall. We reviewed the scan in detail. Both she and her daughter had many excellent questions which I answered to the best of my ability.    04/20/24: ***  Past Medical History:  Diagnosis Date   AAA (abdominal aortic aneurysm) without rupture    ACS (acute coronary syndrome) (HCC) 12/28/2016   Arthritis    osteoarthritis knees   CAD (coronary artery disease)    a. s/p CABG in 1998;  b. s/p prior LAD stenting;  c. 12/2016 Cath: LM 20/50, LAD 50p, diffuse 85m ISR, 80d, 90apical, D1 100, LCX min irregs, OM3 30/40, RCA 40p, 37m, 40d, RPDA 50. EF 40-45%, mid antlat HK, focal apical aneurysmal segment.   Fibromyalgia    GERD (gastroesophageal reflux disease)    Hyperlipidemia    Hyperlipidemia LDL goal <70 07/30/2019    Hypertension    Ischemic cardiomyopathy    a. 12/2016 LV Gram: EF 40-45% w/ moderate mid anterolateral HK and a focal apical aneurysmal segment.   NSTEMI (non-ST elevated myocardial infarction) (HCC) 07/30/2019   Obesity    RBBB    Status post coronary artery bypass graft 08/28/2019   STEMI (ST elevation myocardial infarction) (HCC) 12/28/2016    Past Surgical History:  Procedure Laterality Date   ABDOMINAL AORTIC ENDOVASCULAR STENT GRAFT Bilateral 03/05/2024   Procedure: INSERTION OF ABDOMINAL AORTA ENDOVASCULAR STENT GRAFT;  Surgeon: [name] Debby SAILOR, MD;  Location: [facility] OR;  Service: Vascular;  Laterality: Bilateral;   APPENDECTOMY     BACK SURGERY     ruptured disc   CORONARY ARTERY BYPASS GRAFT     CORONARY STENT INTERVENTION N/A 07/31/2019   Procedure: CORONARY STENT INTERVENTION;  Surgeon: Jordan, Peter M, MD;  Location: [facility] INVASIVE CV LAB;  Service: Cardiovascular;  Laterality: N/A;   EYE SURGERY Bilateral    cataracts   LEFT HEART CATH AND CORONARY ANGIOGRAPHY N/A 12/28/2016   Procedure: LEFT HEART CATH AND CORONARY ANGIOGRAPHY;  Surgeon: Burnard Debby LABOR, MD;  Location: [facility] INVASIVE CV LAB;  Service: Cardiovascular;  Laterality: N/A;   LEFT HEART CATH AND CORS/GRAFTS ANGIOGRAPHY N/A 07/31/2019   Procedure: LEFT HEART CATH AND CORS/GRAFTS ANGIOGRAPHY;  Surgeon: Jordan, Peter M, MD;  Location: [facility] INVASIVE CV LAB;  Service: Cardiovascular;  Laterality: N/A;   ULTRASOUND GUIDANCE FOR VASCULAR ACCESS Bilateral 03/05/2024  Procedure: ULTRASOUND GUIDANCE, FOR VASCULAR ACCESS;  Surgeon: [name] Debby SAILOR, MD;  Location: [facility] OR;  Service: Vascular;  Laterality: Bilateral;    No family history on file.  Social History   Socioeconomic History   Marital status: Widowed    Spouse name: Not on file   Number of children: Not on file   Years of education: Not on file   Highest education level: Not on file  Occupational History   Not on file  Tobacco Use   Smoking status: Former     Current packs/day: 0.00    Types: Cigarettes    Quit date: 12/28/1996    Years since quitting: 27.3   Smokeless tobacco: Former   Tobacco comments:    Smoked cigarettes 1/2 ppd prior quitting.  Vaping Use   Vaping status: Never Used  Substance and Sexual Activity   Alcohol use: No   Drug use: No   Sexual activity: Not Currently    Birth control/protection: Post-menopausal  Other Topics Concern   Not on file  Social History Narrative   Not on file   Social Drivers of Health   Financial Resource Strain: Not on file  Food Insecurity: Low Risk  (10/16/2023)   Received from [facility]   Hunger Vital Sign    Within the past 12 months, you worried that your food would run out before you got money to buy more: Never true    Within the past 12 months, the food you bought just didn't last and you didn't have money to get more. : Never true  Transportation Needs: No Transportation Needs (10/16/2023)   Received from [facility]    In the past 12 months, has lack of reliable transportation kept you from medical appointments, meetings, work or from getting things needed for daily living? : No  Physical Activity: Not on file  Stress: Not on file  Social Connections: Not on file  Intimate Partner Violence: Not on file    Allergies  Allergen Reactions   Phenergan [Promethazine] Anxiety, Rash and Other (See Comments)    Received a shot an an E.D.and immediately became very jittery   Quinolones     6.1 cm infrarenal AAA found May 2025.   Sulfa Antibiotics Rash and Dermatitis    Current Outpatient Medications  Medication Sig Dispense Refill   acetaminophen  (TYLENOL ) 500 MG tablet Take 500-1,000 mg by mouth every 6 (six) hours as needed for mild pain or moderate pain (for headaches or pain).     aspirin  81 MG chewable tablet Chew 1 tablet (81 mg total) by mouth daily.     atorvastatin  (LIPITOR ) 80 MG tablet Take 1 tablet by mouth once daily 90 tablet 2   isosorbide   mononitrate (IMDUR ) 60 MG 24 hr tablet Take 1 tablet (60 mg total) by mouth daily. 90 tablet 2   losartan  (COZAAR ) 25 MG tablet Take 1 tablet by mouth once daily 90 tablet 2   metoprolol  succinate (TOPROL -XL) 50 MG 24 hr tablet Take 1 tablet (50 mg total) by mouth daily. 90 tablet 2   nitroGLYCERIN  (NITROSTAT ) 0.4 MG SL tablet Place 1 tablet (0.4 mg total) under the tongue every 5 (five) minutes as needed for chest pain. 25 tablet 11   pantoprazole  (PROTONIX ) 40 MG tablet Take 1 tablet (40 mg total) by mouth daily. 90 tablet 2   No current facility-administered medications for this visit.    PHYSICAL EXAM There were no vitals filed for this visit.  Elderly woman in no distress The rate and rhythm Unlabored breathing Palpable radial pulses bilaterally Palpable popliteal pulses bilaterally Palpable dorsalis pedis pulses bilaterally Soft, nontender abdomen.  I am not able to palpate an aneurysm.  PERTINENT LABORATORY AND RADIOLOGIC DATA  Most recent CBC    Latest Ref Rng & Units 03/05/2024    1:57 PM 03/02/2024   10:00 AM 06/10/2023   11:11 AM  CBC  WBC 4.0 - 10.5 K/uL 7.7  6.3  5.4   Hemoglobin 12.0 - 15.0 g/dL 87.5  85.6  86.7   Hematocrit 36.0 - 46.0 % 37.2  43.0  40.7   Platelets 150 - 400 K/uL 165  173  237      Most recent CMP    Latest Ref Rng & Units 03/06/2024    4:30 AM 03/05/2024    1:57 PM 03/02/2024   10:00 AM  CMP  Glucose 70 - 99 mg/dL 888   91   BUN 8 - 23 mg/dL 7   8   Creatinine 9.55 - 1.00 mg/dL 9.22  9.31  9.17   Sodium 135 - 145 mmol/L 139   142   Potassium 3.5 - 5.1 mmol/L 3.8   4.0   Chloride 98 - 111 mmol/L 103   104   CO2 22 - 32 mmol/L 25   28   Calcium  8.9 - 10.3 mg/dL 8.9   9.1   Total Protein 6.5 - 8.1 g/dL   7.0   Total Bilirubin 0.0 - 1.2 mg/dL   1.0   Alkaline Phos 38 - 126 U/L   89   AST 15 - 41 U/L   20   ALT 0 - 44 U/L   15     Renal function CrCl cannot be calculated (Patient's most recent lab result is older than the  maximum 21 days allowed.).  Hgb A1c MFr Bld (%)  Date Value  07/31/2019 5.0    LDL Chol Calc (NIH)  Date Value Ref Range Status  04/06/2022 51 0 - 99 mg/dL Final   LDL Direct  Date Value Ref Range Status  06/10/2023 91 0 - 99 mg/dL Final    CT angiogram 88/73/7974. Personally reviewed. Scan good technical result from endovascular repair.  No evidence of endoleak.  Debby SAILOR. [name], MD FACS [facility] Phone Number: [phone] 04/20/2024 11:39 AM   Total time spent on preparing this encounter including chart review, data review, collecting history, examining the patient, and coordinating care: 30 min.  Portions of this report may have been transcribed using voice recognition software.  Every effort has been made to ensure accuracy; however, inadvertent computerized transcription errors may still be present.

## 2024-04-21 ENCOUNTER — Ambulatory Visit: Admitting: Vascular Surgery

## 2024-05-19 ENCOUNTER — Encounter: Admitting: Vascular Surgery

## 2024-06-26 ENCOUNTER — Other Ambulatory Visit: Payer: Self-pay | Admitting: Cardiology

## 2024-06-30 ENCOUNTER — Ambulatory Visit: Admitting: Vascular Surgery
